# Patient Record
Sex: Male | Born: 1960 | Race: White | Hispanic: No | Marital: Married | Smoking: Never smoker
Health system: Southern US, Community
[De-identification: ages and names within clinical notes are randomized; demographics above are authoritative.]

## PROBLEM LIST (undated history)

## (undated) HISTORY — PX: GASTRIC BYPASS: SHX52

---

## 2007-07-16 ENCOUNTER — Ambulatory Visit: Payer: Self-pay | Admitting: Internal Medicine

## 2014-10-06 DIAGNOSIS — G4733 Obstructive sleep apnea (adult) (pediatric): Secondary | ICD-10-CM | POA: Insufficient documentation

## 2014-10-06 DIAGNOSIS — I10 Essential (primary) hypertension: Secondary | ICD-10-CM | POA: Insufficient documentation

## 2021-01-31 ENCOUNTER — Other Ambulatory Visit: Payer: Self-pay | Admitting: Neurology

## 2021-01-31 DIAGNOSIS — R2689 Other abnormalities of gait and mobility: Secondary | ICD-10-CM

## 2021-02-09 ENCOUNTER — Ambulatory Visit
Admission: RE | Admit: 2021-02-09 | Discharge: 2021-02-09 | Disposition: A | Discharge status: Home / Self Care | Payer: 59 | Source: Ambulatory Visit | Attending: Neurology | Admitting: Neurology

## 2021-02-09 ENCOUNTER — Ambulatory Visit: Admission: RE | Admit: 2021-02-09 | Payer: Self-pay | Source: Ambulatory Visit

## 2021-02-09 ENCOUNTER — Other Ambulatory Visit: Payer: Self-pay

## 2021-02-09 ENCOUNTER — Ambulatory Visit: Payer: Self-pay

## 2021-02-09 DIAGNOSIS — R2689 Other abnormalities of gait and mobility: Secondary | ICD-10-CM

## 2021-02-09 IMAGING — MR MR LUMBAR SPINE W/O CM
4 of 5 series · 29 of 48 positions shown · non-contrast
Comparison: None.

CLINICAL DATA: Imbalance.

EXAM:
MRI CERVICAL, THORACIC AND LUMBAR SPINE WITHOUT CONTRAST
TECHNIQUE: Multiplanar and multiecho pulse sequences of the cervical spine, to
include the craniocervical junction and cervicothoracic junction,
and thoracic and lumbar spine, were obtained without intravenous
contrast.

[Series 5: T2 · sagittal · 4.0mm · 0.81mm/px · 6 of 17 slices shown (1 of 2)]
[im 1/17]
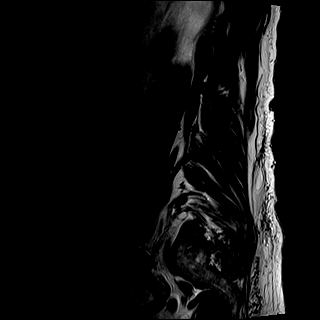
[im 4/17]
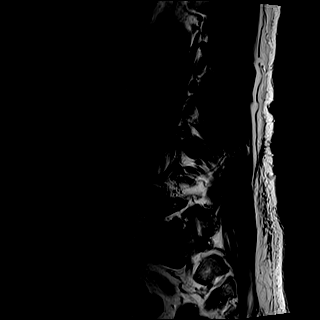
[im 7/17]
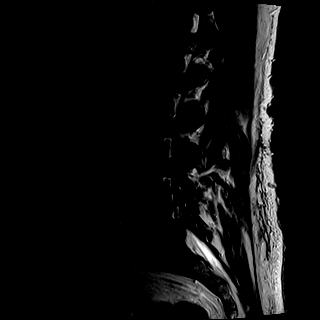
[im 10/17]
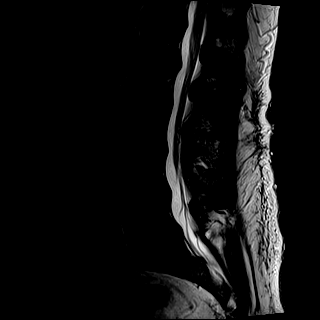
[im 13/17]
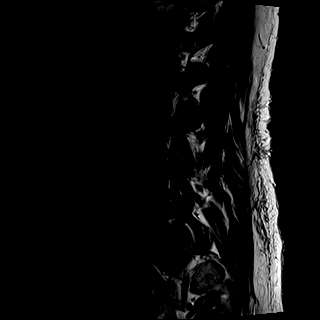
[im 17/17]
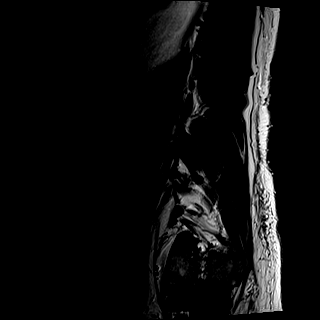

[Series 6: T1 · sagittal · 4.0mm · 0.81mm/px · 7 of 17 slices shown (1 of 2)]
[im 1/17]
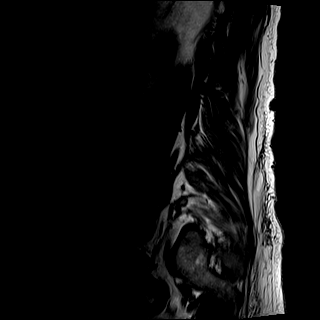
[im 3/17]
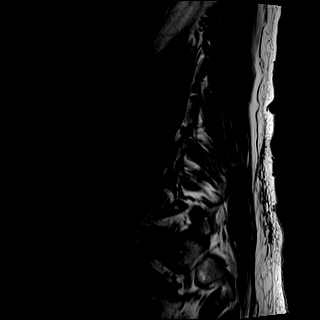
[im 6/17]
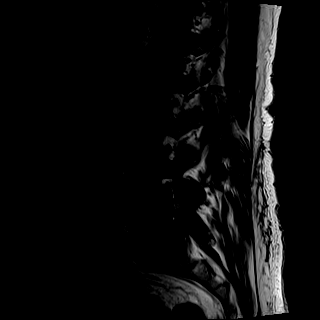
[im 9/17]
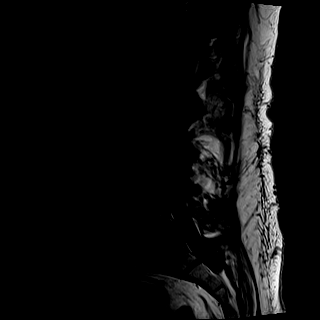
[im 11/17]
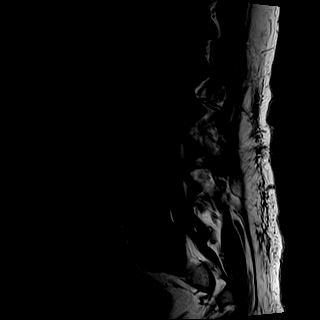
[im 14/17]
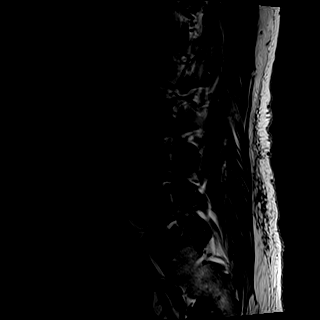
[im 17/17]
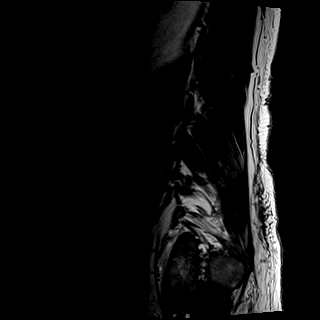

[Series 8: T2 · axial · 4.0mm · 0.78mm/px · z∈[-696,-485]mm · 8 of 36 slices shown (2 of 2)]
[im 1/36]
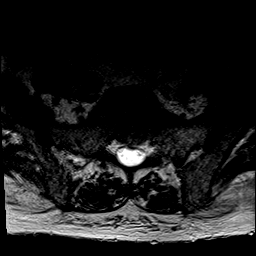
[im 6/36]
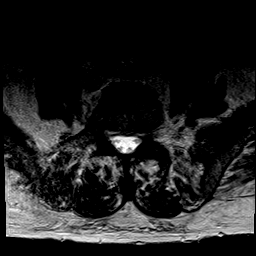
[im 11/36]
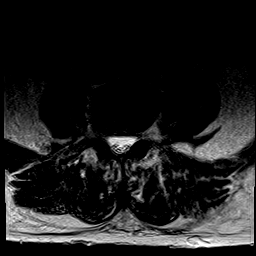
[im 17/36]
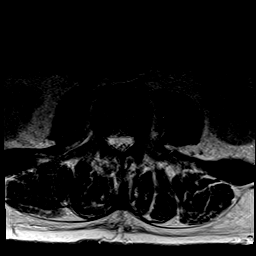
[im 19/36]
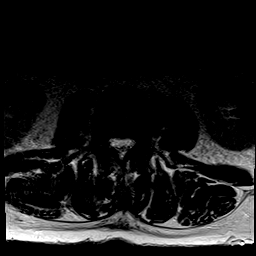
[im 25/36]
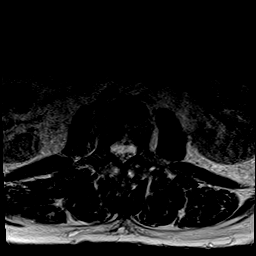
[im 30/36]
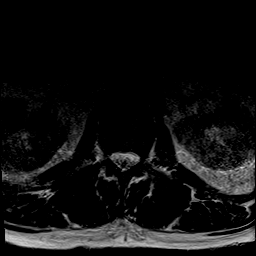
[im 36/36]
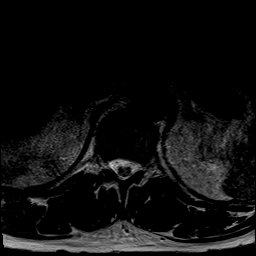

[Series 9: T1 · axial · 4.0mm · 0.39mm/px · z∈[-696,-485]mm · 8 of 36 slices shown (2 of 2)]
[im 1/36]
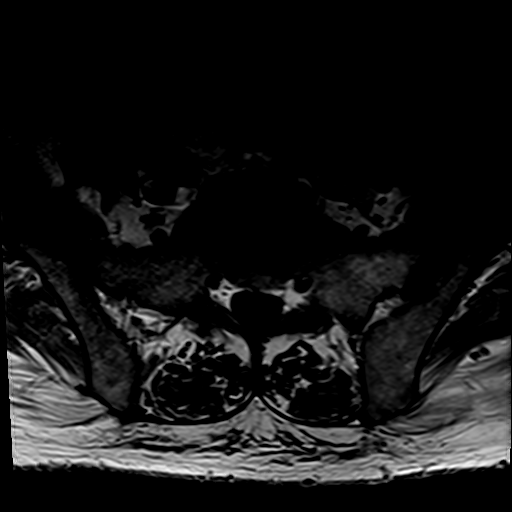
[im 6/36]
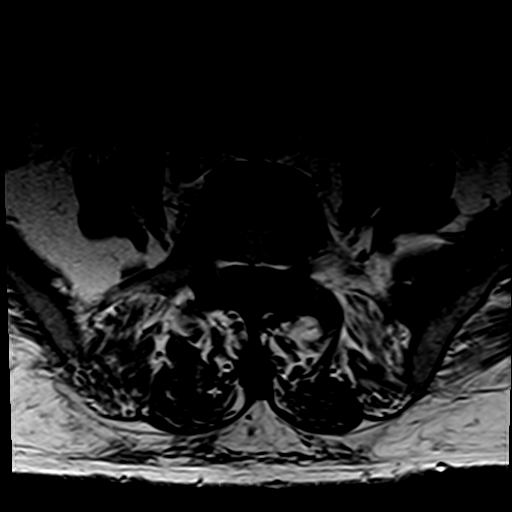
[im 11/36]
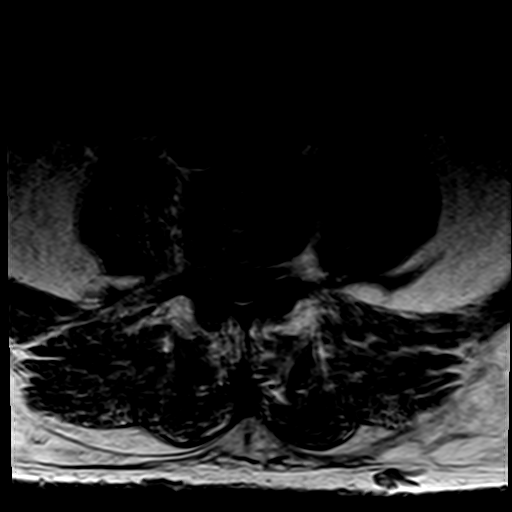
[im 17/36]
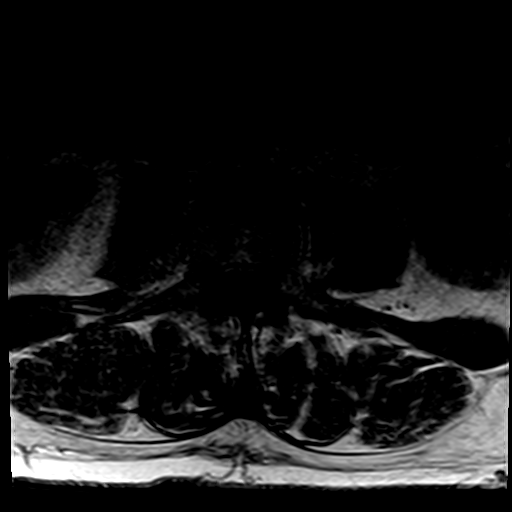
[im 19/36]
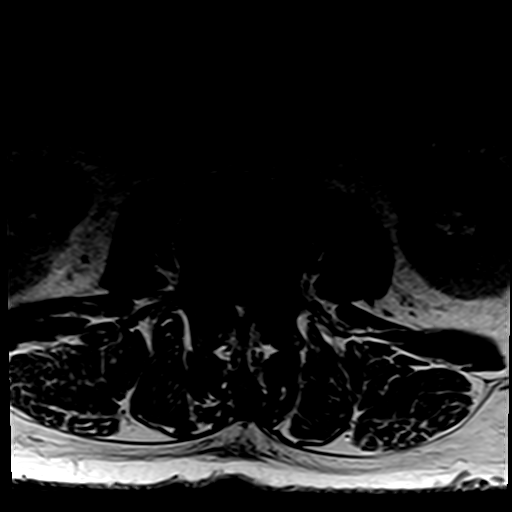
[im 25/36]
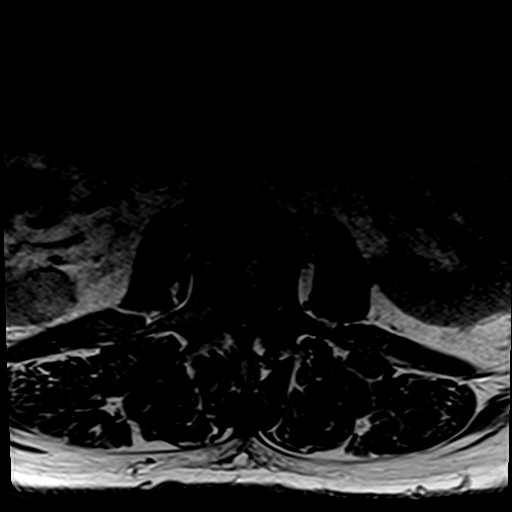
[im 30/36]
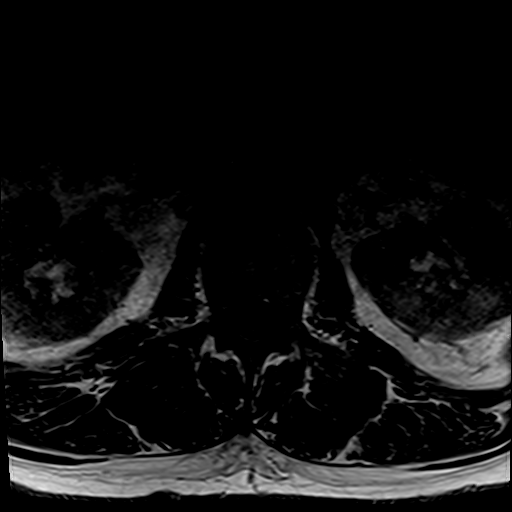
[im 36/36]
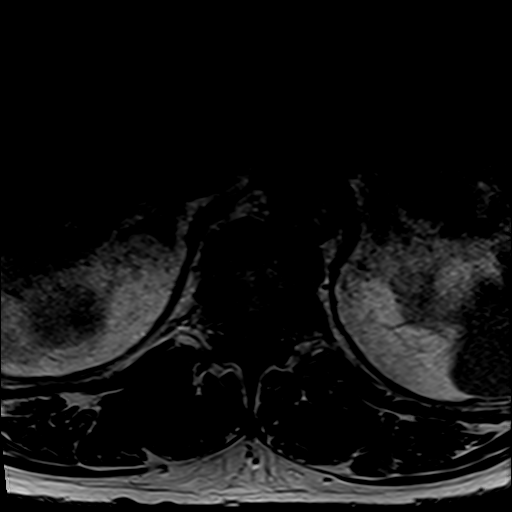

[29 of 48 positions shown; findings below may reference images not displayed]

FINDINGS: MRI CERVICAL SPINE FINDINGS

Alignment: Straightening of the cervical curvature.

Vertebrae: No fracture, evidence of discitis, or bone lesion.

Cord: Normal signal and morphology.

Posterior Fossa, vertebral arteries, paraspinal tissues: Negative.

Disc levels:

C2-3: Facet degenerative loss, left greater than right, resulting in
mild left neural foraminal narrowing. No spinal canal stenosis.

C3-4: Uncovertebral and facet degenerative changes resulting in mild
right and moderate left neural foraminal narrowing. No spinal canal
stenosis.

C4-5: Posterior disc protrusion resulting in mild spinal canal
stenosis with mild mass effect on the cord without cord signal
abnormality. Uncovertebral and facet degenerative changes resulting
severe right and moderate left neural foraminal narrowing.

C5-6: Posterior disc protrusion resulting in mild spinal canal
stenosis. Uncovertebral and facet degenerative changes resulting in
moderate bilateral neural foraminal narrowing.

C6-7: Left posterolateral disc protrude resulting in mild spinal
canal stenosis. Uncovertebral and facet degenerative changes
resulting in severe left neural foraminal narrowing.

C7-T1: Small posterior disc protrusion without significant spinal
canal or neural foraminal stenosis.

MRI THORACIC SPINE FINDINGS

Alignment:  Physiologic.

Vertebrae: No fracture, evidence of discitis, or bone lesion.
Prominent anterior osteophyte at T11-12.

Cord:  No cord signal abnormality.

Paraspinal and other soft tissues: Incidentally noted degenerative
changes of the sternoclavicular joint.

Disc levels:

T1-2: Small right paracentral disc protrusion. No significant spinal
canal or neural foraminal stenosis.

T2-3: Shallow disc bulge. No spinal canal or neural foraminal
stenosis.

T3-4: No spinal canal or neural foraminal stenosis.

T4-5: Small central disc protrusion causes small indentation of the
anterior cord surface without cord signal abnormality. No
significant spinal canal or neural foraminal stenosis.

T5-6: Small left posterolateral disc protrusion causing small
indentation of the thecal sac without significant spinal canal or
neural foraminal stenosis.

T6-7: Small central disc protrusion causing indentation of the
thecal sac without significant spinal canal or neural foraminal
stenosis.

T7-8: Right posterolateral disc protrusion causing mild mass effect
on the cord with flattening of the anterior cord contour without
cord signal abnormality. No significant spinal canal or neural
foraminal stenosis.

T8-9: No significant spinal canal or neural foraminal stenosis.

T9-10: No significant spinal canal or neural foraminal stenosis.

T10-11: Disc bulge and facet degenerative changes resulting in mild
right and moderate left neural foraminal narrowing. No significant
spinal canal stenosis.

T11-12: Mild facet degenerative changes. No spinal canal or neural
foraminal stenosis.

T12-L1: Mild facet degenerative changes. No spinal canal or neural
foraminal stenosis.

MRI LUMBAR SPINE FINDINGS

Axial images are degraded by motion.

Segmentation: Standard.

Alignment:  Physiologic.

Vertebrae:  No fracture, evidence of discitis, or bone lesion.

Conus medullaris and cauda equina: Conus extends to the L1 level.
Conus and cauda equina appear normal.

Paraspinal and other soft tissues: Negative.

Disc levels:

L1-2: Shallow disc bulge and mild facet degenerative changes
resulting in mild bilateral neural foraminal narrowing no
significant spinal canal stenosis.

L2-3: Mild facet degenerative changes. No significant spinal canal
or neural foraminal stenosis.

L3-4: Disc bulge moderate facet degenerative changes resulting in
mild spinal canal stenosis and mild bilateral neural foraminal
narrowing.

L4-5: Disc bulge and moderate facet degenerative changes with trace
joint effusion. Findings result in mild spinal canal stenosis and
moderate bilateral neural foraminal narrowing.

L5-S1: Disc bulge moderate facet degenerative changes with bilateral
joint effusion resulting in moderate to severe right and severe left
neural foraminal narrowing. No significant spinal canal stenosis.
IMPRESSION: 1. No intrinsic cord lesion or cord signal abnormality identified.
2. Degenerative changes of the cervical spine with mild spinal canal
stenosis at C4-5 level C5-6 and C6-7.
3. Multilevel high-grade neural foraminal stenosis of the cervical
spine with severe right and moderate left neural foraminal narrowing
at C4-5, moderate bilateral at C5-6 and severe left neural foraminal
narrowing at C6-7.
4. Mild degenerative changes of the thoracic spine without
high-grade spinal canal stenosis at any level. Mild right and
moderate left neural foraminal narrowing at T10-11.
5. Degenerative changes of the lumbar spine with moderate bilateral
neural foraminal narrowing at L4-5, moderate to severe right and
severe left neural foraminal narrowing at L5-S1. No high-grade
spinal canal stenosis.

## 2021-02-09 IMAGING — MR MR CERVICAL SPINE W/O CM
5 series · 35 of 48 positions shown · non-contrast
Comparison: None.

CLINICAL DATA: Imbalance.

EXAM:
MRI CERVICAL, THORACIC AND LUMBAR SPINE WITHOUT CONTRAST
TECHNIQUE: Multiplanar and multiecho pulse sequences of the cervical spine, to
include the craniocervical junction and cervicothoracic junction,
and thoracic and lumbar spine, were obtained without intravenous
contrast.

[Series 5: T2 · sagittal · 3.0mm · 0.62mm/px · 7 of 15 slices shown (1 of 2)]
[im 1/15]
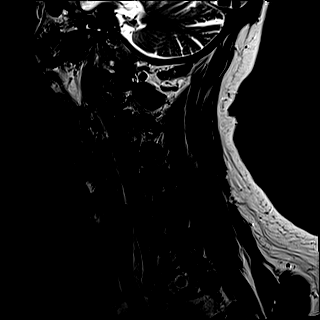
[im 3/15]
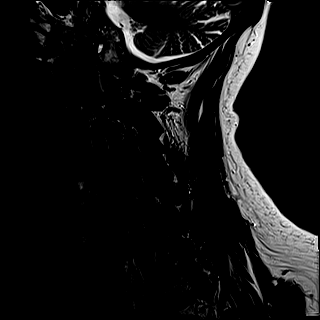
[im 5/15]
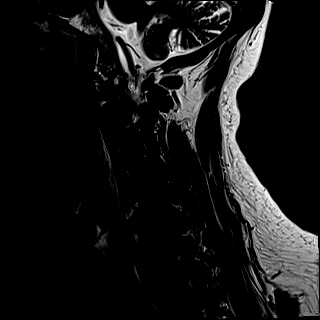
[im 8/15]
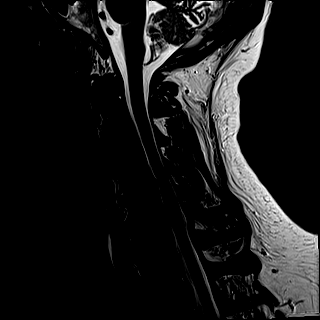
[im 10/15]
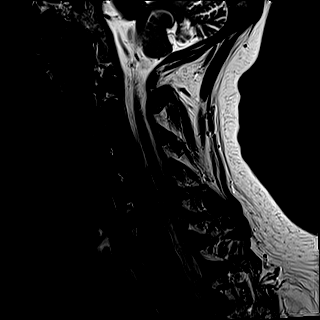
[im 12/15]
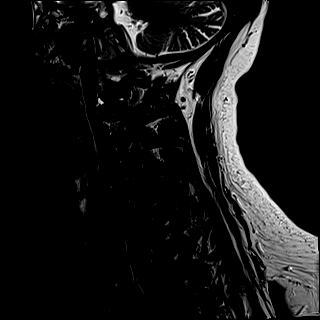
[im 15/15]
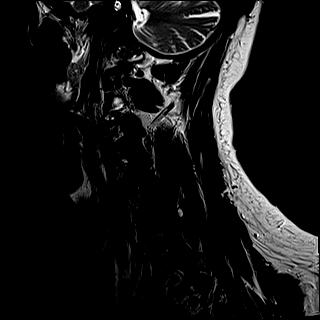

[Series 7: FLAIR · sagittal · 3.0mm · 0.78mm/px · 7 of 15 slices shown]
[im 1/15]
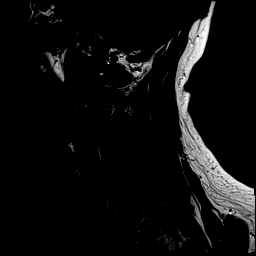
[im 3/15]
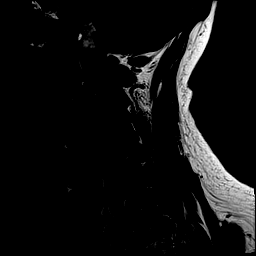
[im 5/15]
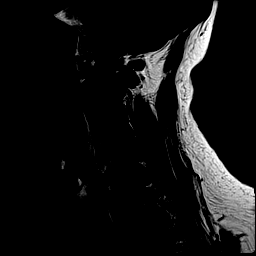
[im 8/15]
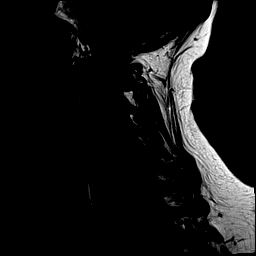
[im 10/15]
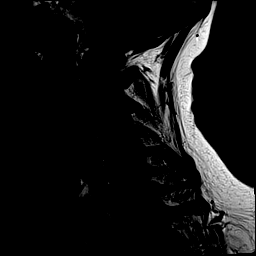
[im 12/15]
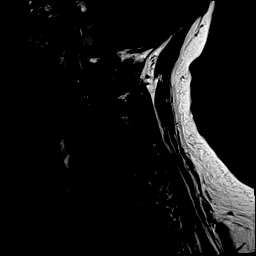
[im 15/15]
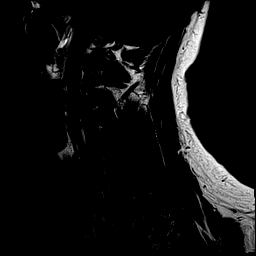

[Series 8: STIR · sagittal · 3.0mm · 0.62mm/px · 7 of 15 slices shown]
[im 1/15]
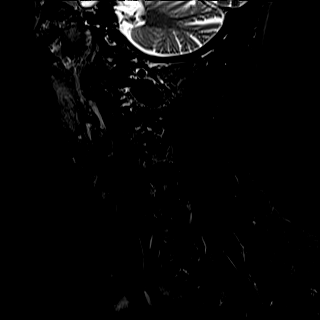
[im 3/15]
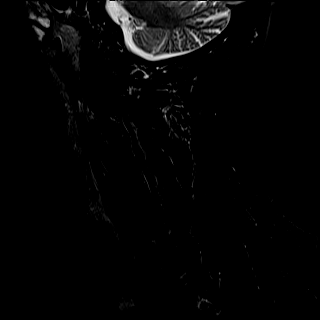
[im 5/15]
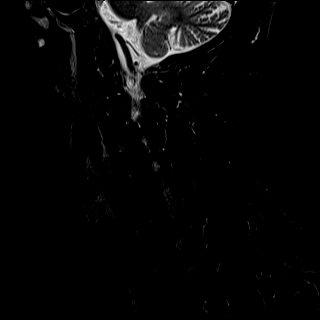
[im 8/15]
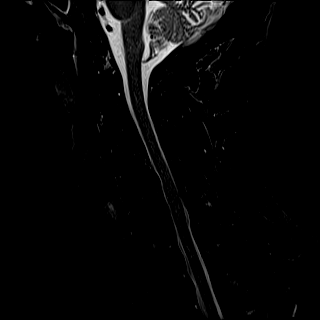
[im 10/15]
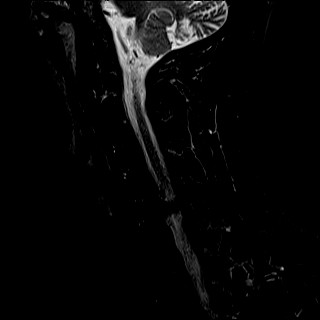
[im 12/15]
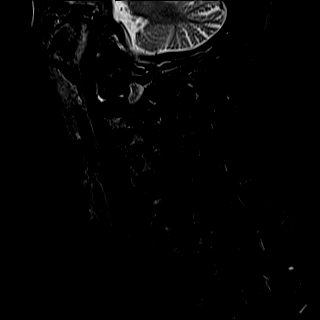
[im 15/15]
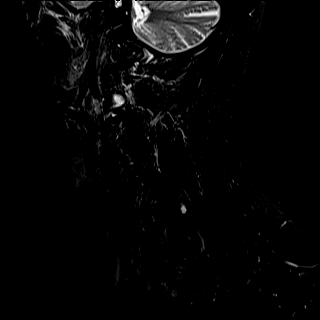

[Series 9: T2 · axial · 3.0mm · 0.70mm/px · z∈[-230,-134]mm · 9 of 28 slices shown (2 of 2)]
[im 1/28]
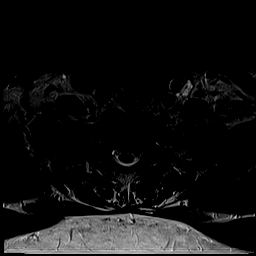
[im 5/28]
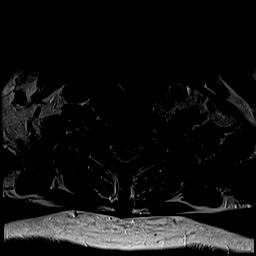
[im 10/28]
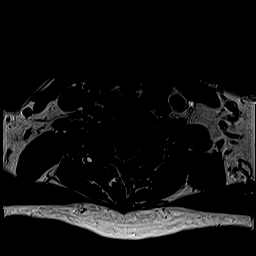
[im 12/28]
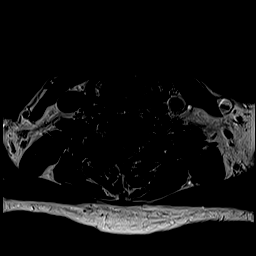
[im 14/28]
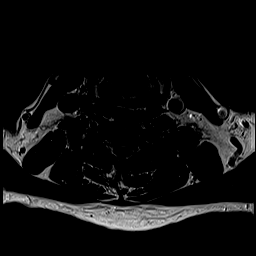
[im 16/28]
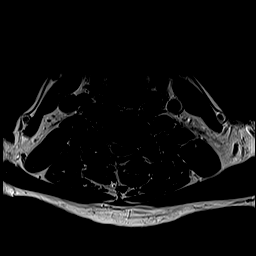
[im 19/28]
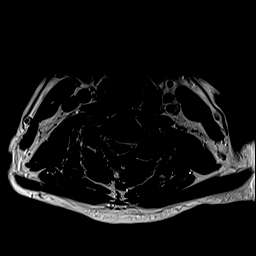
[im 23/28]
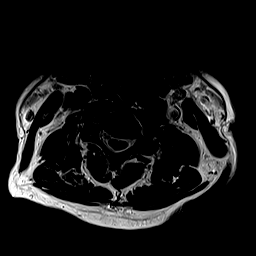
[im 28/28]
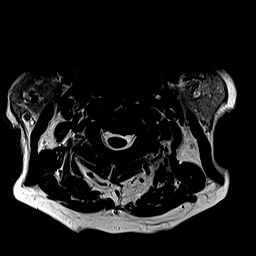

[Series 10: ax mpgr · axial · 3.0mm · 0.35mm/px · z∈[-230,-179]mm · 5 of 29 slices shown]
[im 1/29]
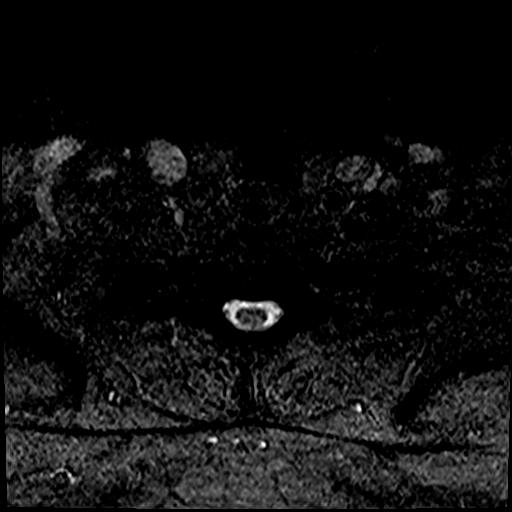
[im 5/29]
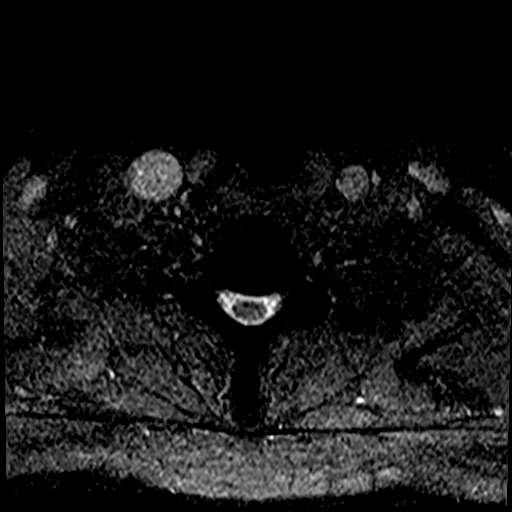
[im 9/29]
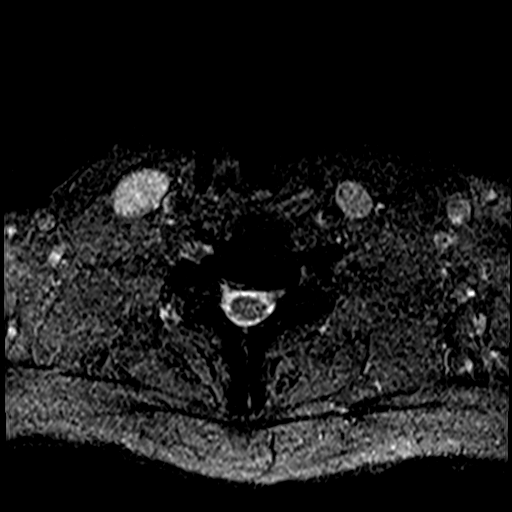
[im 13/29]
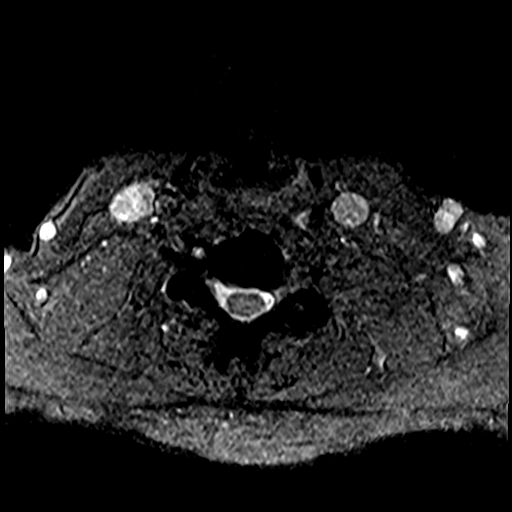
[im 16/29]
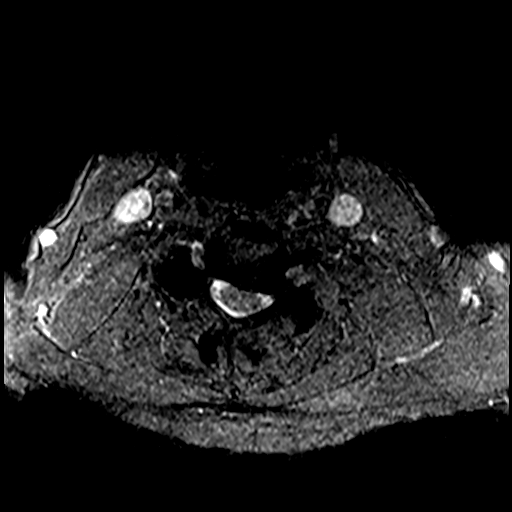

[35 of 48 positions shown; findings below may reference images not displayed]

FINDINGS: MRI CERVICAL SPINE FINDINGS

Alignment: Straightening of the cervical curvature.

Vertebrae: No fracture, evidence of discitis, or bone lesion.

Cord: Normal signal and morphology.

Posterior Fossa, vertebral arteries, paraspinal tissues: Negative.

Disc levels:

C2-3: Facet degenerative loss, left greater than right, resulting in
mild left neural foraminal narrowing. No spinal canal stenosis.

C3-4: Uncovertebral and facet degenerative changes resulting in mild
right and moderate left neural foraminal narrowing. No spinal canal
stenosis.

C4-5: Posterior disc protrusion resulting in mild spinal canal
stenosis with mild mass effect on the cord without cord signal
abnormality. Uncovertebral and facet degenerative changes resulting
severe right and moderate left neural foraminal narrowing.

C5-6: Posterior disc protrusion resulting in mild spinal canal
stenosis. Uncovertebral and facet degenerative changes resulting in
moderate bilateral neural foraminal narrowing.

C6-7: Left posterolateral disc protrude resulting in mild spinal
canal stenosis. Uncovertebral and facet degenerative changes
resulting in severe left neural foraminal narrowing.

C7-T1: Small posterior disc protrusion without significant spinal
canal or neural foraminal stenosis.

MRI THORACIC SPINE FINDINGS

Alignment:  Physiologic.

Vertebrae: No fracture, evidence of discitis, or bone lesion.
Prominent anterior osteophyte at T11-12.

Cord:  No cord signal abnormality.

Paraspinal and other soft tissues: Incidentally noted degenerative
changes of the sternoclavicular joint.

Disc levels:

T1-2: Small right paracentral disc protrusion. No significant spinal
canal or neural foraminal stenosis.

T2-3: Shallow disc bulge. No spinal canal or neural foraminal
stenosis.

T3-4: No spinal canal or neural foraminal stenosis.

T4-5: Small central disc protrusion causes small indentation of the
anterior cord surface without cord signal abnormality. No
significant spinal canal or neural foraminal stenosis.

T5-6: Small left posterolateral disc protrusion causing small
indentation of the thecal sac without significant spinal canal or
neural foraminal stenosis.

T6-7: Small central disc protrusion causing indentation of the
thecal sac without significant spinal canal or neural foraminal
stenosis.

T7-8: Right posterolateral disc protrusion causing mild mass effect
on the cord with flattening of the anterior cord contour without
cord signal abnormality. No significant spinal canal or neural
foraminal stenosis.

T8-9: No significant spinal canal or neural foraminal stenosis.

T9-10: No significant spinal canal or neural foraminal stenosis.

T10-11: Disc bulge and facet degenerative changes resulting in mild
right and moderate left neural foraminal narrowing. No significant
spinal canal stenosis.

T11-12: Mild facet degenerative changes. No spinal canal or neural
foraminal stenosis.

T12-L1: Mild facet degenerative changes. No spinal canal or neural
foraminal stenosis.

MRI LUMBAR SPINE FINDINGS

Axial images are degraded by motion.

Segmentation: Standard.

Alignment:  Physiologic.

Vertebrae:  No fracture, evidence of discitis, or bone lesion.

Conus medullaris and cauda equina: Conus extends to the L1 level.
Conus and cauda equina appear normal.

Paraspinal and other soft tissues: Negative.

Disc levels:

L1-2: Shallow disc bulge and mild facet degenerative changes
resulting in mild bilateral neural foraminal narrowing no
significant spinal canal stenosis.

L2-3: Mild facet degenerative changes. No significant spinal canal
or neural foraminal stenosis.

L3-4: Disc bulge moderate facet degenerative changes resulting in
mild spinal canal stenosis and mild bilateral neural foraminal
narrowing.

L4-5: Disc bulge and moderate facet degenerative changes with trace
joint effusion. Findings result in mild spinal canal stenosis and
moderate bilateral neural foraminal narrowing.

L5-S1: Disc bulge moderate facet degenerative changes with bilateral
joint effusion resulting in moderate to severe right and severe left
neural foraminal narrowing. No significant spinal canal stenosis.
IMPRESSION: 1. No intrinsic cord lesion or cord signal abnormality identified.
2. Degenerative changes of the cervical spine with mild spinal canal
stenosis at C4-5 level C5-6 and C6-7.
3. Multilevel high-grade neural foraminal stenosis of the cervical
spine with severe right and moderate left neural foraminal narrowing
at C4-5, moderate bilateral at C5-6 and severe left neural foraminal
narrowing at C6-7.
4. Mild degenerative changes of the thoracic spine without
high-grade spinal canal stenosis at any level. Mild right and
moderate left neural foraminal narrowing at T10-11.
5. Degenerative changes of the lumbar spine with moderate bilateral
neural foraminal narrowing at L4-5, moderate to severe right and
severe left neural foraminal narrowing at L5-S1. No high-grade
spinal canal stenosis.

## 2021-02-09 IMAGING — MR MR THORACIC SPINE W/O CM
5 of 6 series · 26 of 48 positions shown · non-contrast
Comparison: None.

CLINICAL DATA: Imbalance.

EXAM:
MRI CERVICAL, THORACIC AND LUMBAR SPINE WITHOUT CONTRAST
TECHNIQUE: Multiplanar and multiecho pulse sequences of the cervical spine, to
include the craniocervical junction and cervicothoracic junction,
and thoracic and lumbar spine, were obtained without intravenous
contrast.

[Series 22: T1 · sagittal · 6.0mm · 1.41mm/px · 2 of 9 slices shown (1 of 2)]
[im 1/9]
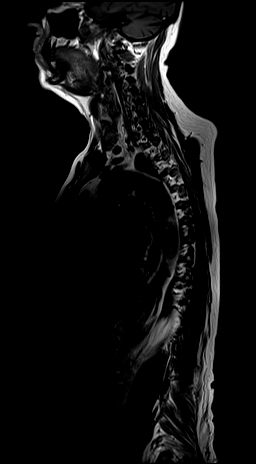
[im 9/9]
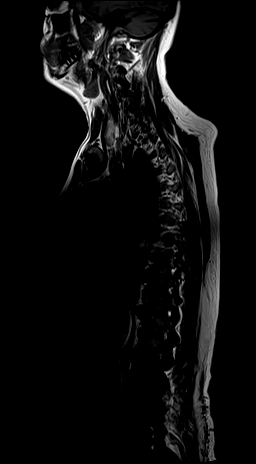

[Series 23: T2 · sagittal · 3.0mm · 1.06mm/px · 6 of 17 slices shown (1 of 2)]
[im 1/17]
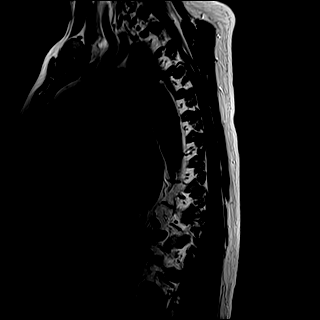
[im 4/17]
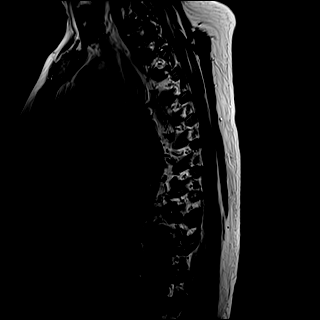
[im 7/17]
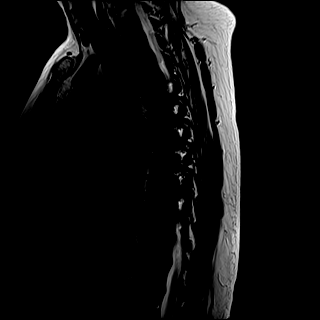
[im 10/17]
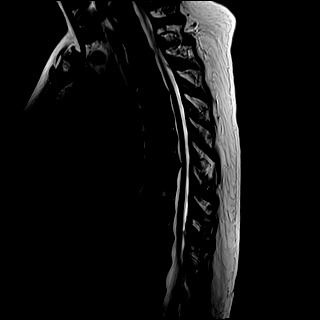
[im 13/17]
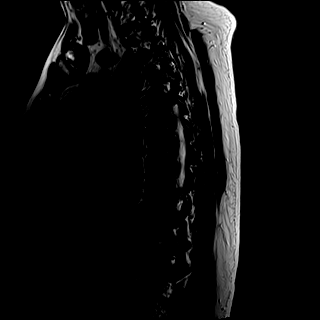
[im 17/17]
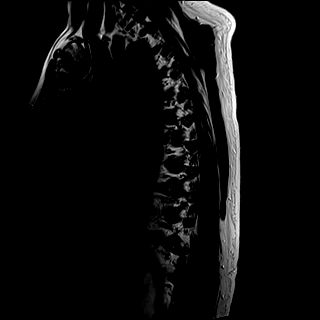

[Series 24: T1 · sagittal · 3.0mm · 1.06mm/px · 6 of 17 slices shown (2 of 2)]
[im 1/17]
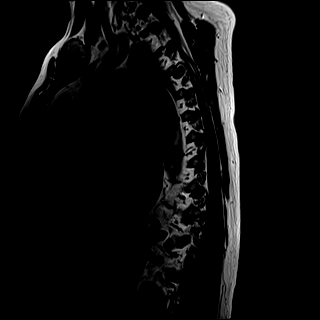
[im 4/17]
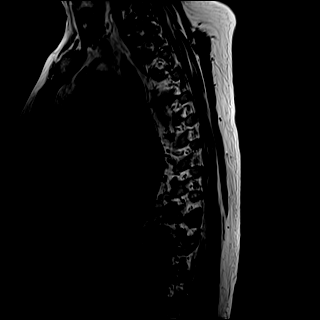
[im 7/17]
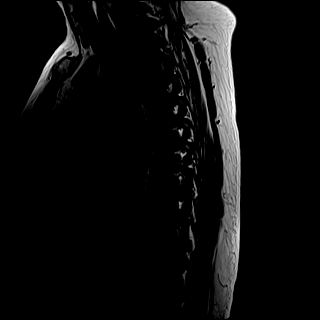
[im 10/17]
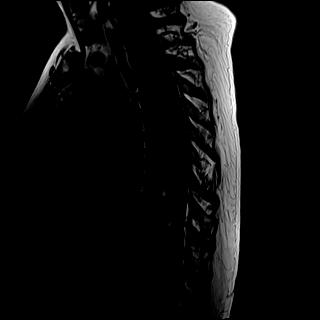
[im 13/17]
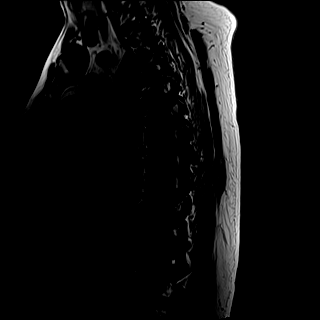
[im 17/17]
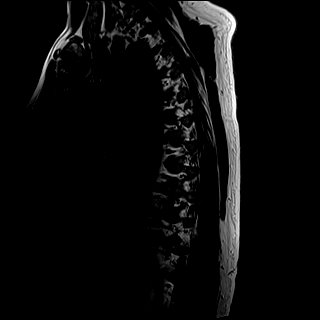

[Series 25: STIR · sagittal · 3.0mm · 0.53mm/px · 4 of 17 slices shown]
[im 1/17]
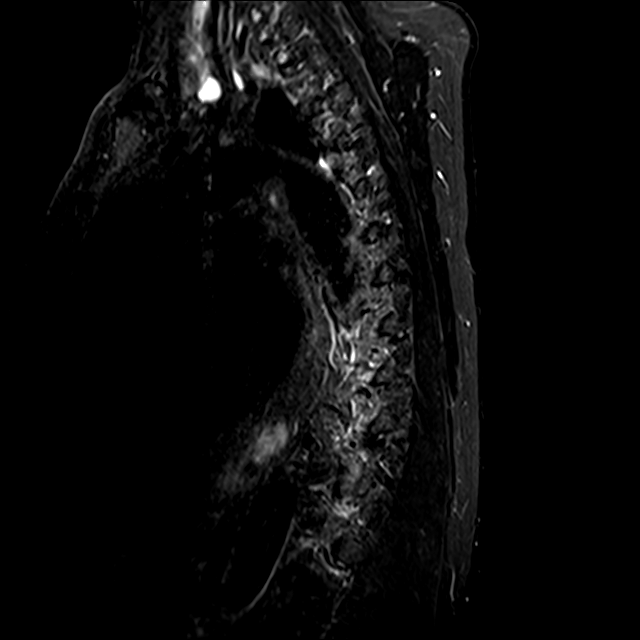
[im 4/17]
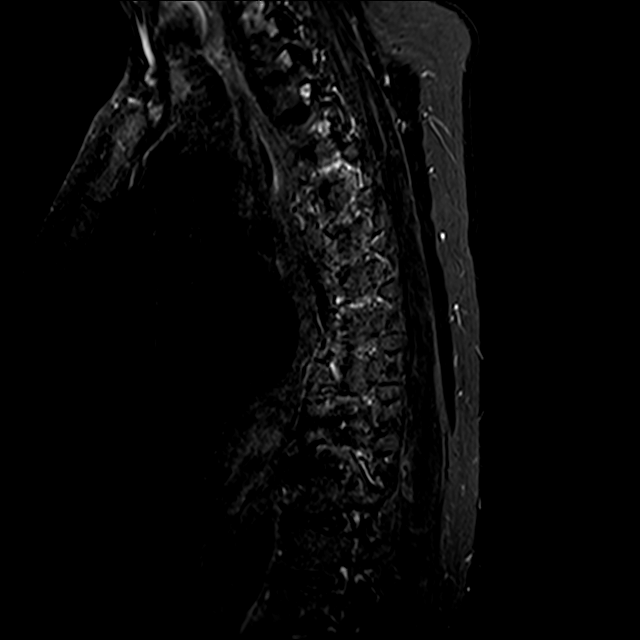
[im 7/17]
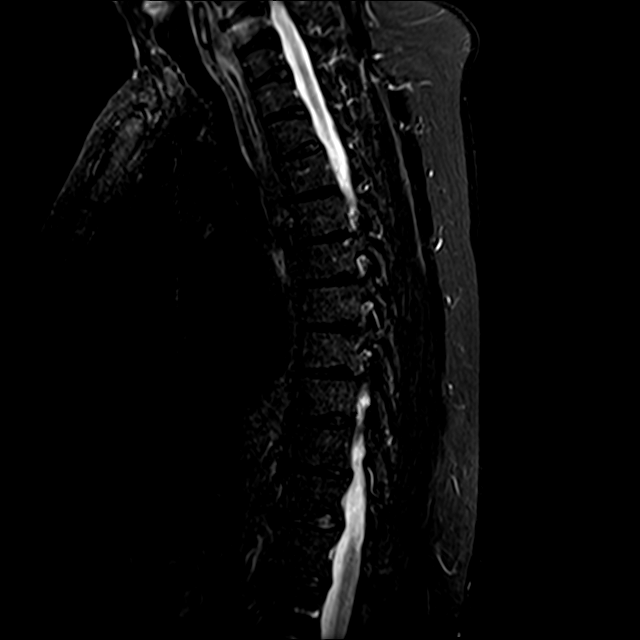
[im 10/17]
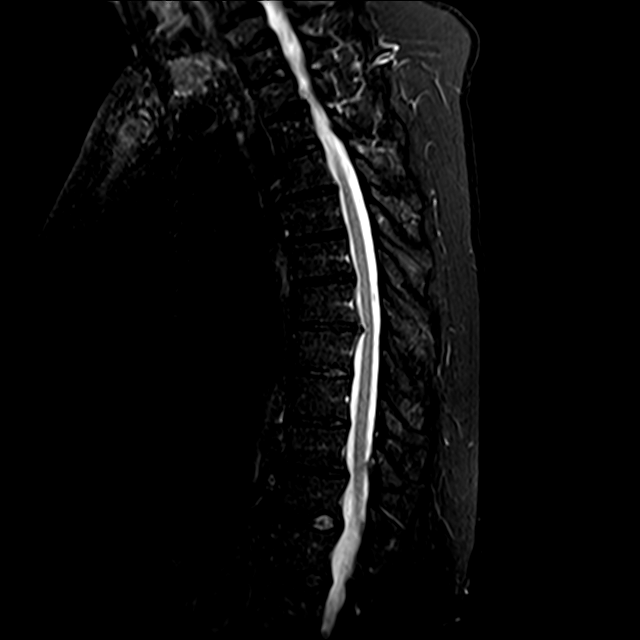

[Series 26: T2 · axial · 4.0mm · 0.59mm/px · z∈[-483,-242]mm · 8 of 39 slices shown (2 of 2)]
[im 1/39]
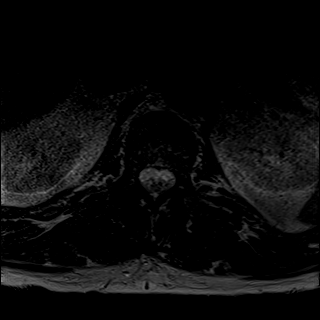
[im 6/39]
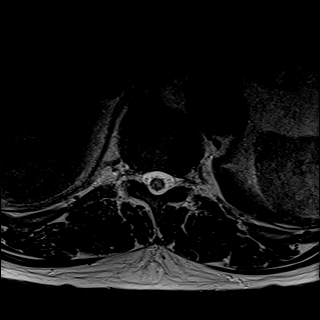
[im 12/39]
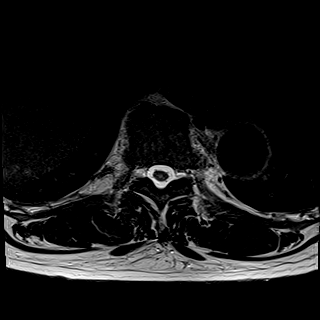
[im 18/39]
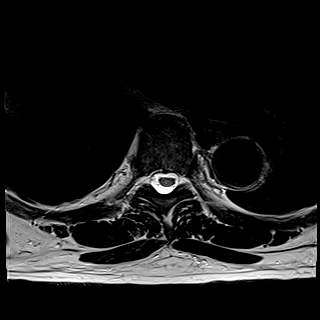
[im 21/39]
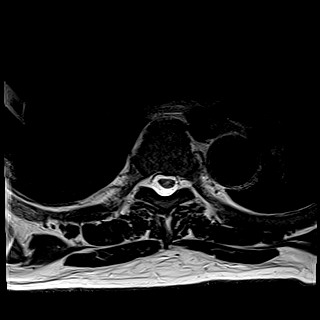
[im 27/39]
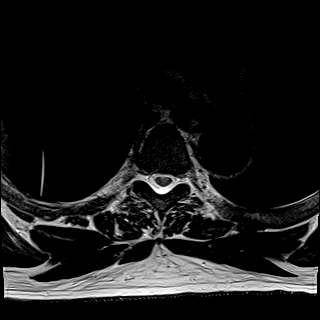
[im 33/39]
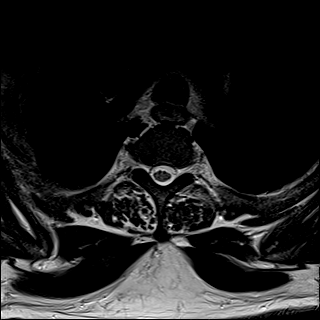
[im 39/39]
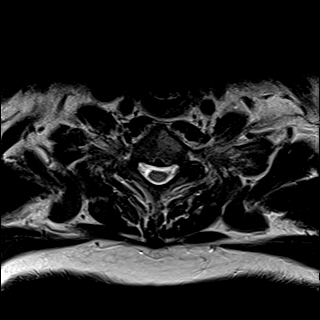

[26 of 48 positions shown; findings below may reference images not displayed]

FINDINGS: MRI CERVICAL SPINE FINDINGS

Alignment: Straightening of the cervical curvature.

Vertebrae: No fracture, evidence of discitis, or bone lesion.

Cord: Normal signal and morphology.

Posterior Fossa, vertebral arteries, paraspinal tissues: Negative.

Disc levels:

C2-3: Facet degenerative loss, left greater than right, resulting in
mild left neural foraminal narrowing. No spinal canal stenosis.

C3-4: Uncovertebral and facet degenerative changes resulting in mild
right and moderate left neural foraminal narrowing. No spinal canal
stenosis.

C4-5: Posterior disc protrusion resulting in mild spinal canal
stenosis with mild mass effect on the cord without cord signal
abnormality. Uncovertebral and facet degenerative changes resulting
severe right and moderate left neural foraminal narrowing.

C5-6: Posterior disc protrusion resulting in mild spinal canal
stenosis. Uncovertebral and facet degenerative changes resulting in
moderate bilateral neural foraminal narrowing.

C6-7: Left posterolateral disc protrude resulting in mild spinal
canal stenosis. Uncovertebral and facet degenerative changes
resulting in severe left neural foraminal narrowing.

C7-T1: Small posterior disc protrusion without significant spinal
canal or neural foraminal stenosis.

MRI THORACIC SPINE FINDINGS

Alignment:  Physiologic.

Vertebrae: No fracture, evidence of discitis, or bone lesion.
Prominent anterior osteophyte at T11-12.

Cord:  No cord signal abnormality.

Paraspinal and other soft tissues: Incidentally noted degenerative
changes of the sternoclavicular joint.

Disc levels:

T1-2: Small right paracentral disc protrusion. No significant spinal
canal or neural foraminal stenosis.

T2-3: Shallow disc bulge. No spinal canal or neural foraminal
stenosis.

T3-4: No spinal canal or neural foraminal stenosis.

T4-5: Small central disc protrusion causes small indentation of the
anterior cord surface without cord signal abnormality. No
significant spinal canal or neural foraminal stenosis.

T5-6: Small left posterolateral disc protrusion causing small
indentation of the thecal sac without significant spinal canal or
neural foraminal stenosis.

T6-7: Small central disc protrusion causing indentation of the
thecal sac without significant spinal canal or neural foraminal
stenosis.

T7-8: Right posterolateral disc protrusion causing mild mass effect
on the cord with flattening of the anterior cord contour without
cord signal abnormality. No significant spinal canal or neural
foraminal stenosis.

T8-9: No significant spinal canal or neural foraminal stenosis.

T9-10: No significant spinal canal or neural foraminal stenosis.

T10-11: Disc bulge and facet degenerative changes resulting in mild
right and moderate left neural foraminal narrowing. No significant
spinal canal stenosis.

T11-12: Mild facet degenerative changes. No spinal canal or neural
foraminal stenosis.

T12-L1: Mild facet degenerative changes. No spinal canal or neural
foraminal stenosis.

MRI LUMBAR SPINE FINDINGS

Axial images are degraded by motion.

Segmentation: Standard.

Alignment:  Physiologic.

Vertebrae:  No fracture, evidence of discitis, or bone lesion.

Conus medullaris and cauda equina: Conus extends to the L1 level.
Conus and cauda equina appear normal.

Paraspinal and other soft tissues: Negative.

Disc levels:

L1-2: Shallow disc bulge and mild facet degenerative changes
resulting in mild bilateral neural foraminal narrowing no
significant spinal canal stenosis.

L2-3: Mild facet degenerative changes. No significant spinal canal
or neural foraminal stenosis.

L3-4: Disc bulge moderate facet degenerative changes resulting in
mild spinal canal stenosis and mild bilateral neural foraminal
narrowing.

L4-5: Disc bulge and moderate facet degenerative changes with trace
joint effusion. Findings result in mild spinal canal stenosis and
moderate bilateral neural foraminal narrowing.

L5-S1: Disc bulge moderate facet degenerative changes with bilateral
joint effusion resulting in moderate to severe right and severe left
neural foraminal narrowing. No significant spinal canal stenosis.
IMPRESSION: 1. No intrinsic cord lesion or cord signal abnormality identified.
2. Degenerative changes of the cervical spine with mild spinal canal
stenosis at C4-5 level C5-6 and C6-7.
3. Multilevel high-grade neural foraminal stenosis of the cervical
spine with severe right and moderate left neural foraminal narrowing
at C4-5, moderate bilateral at C5-6 and severe left neural foraminal
narrowing at C6-7.
4. Mild degenerative changes of the thoracic spine without
high-grade spinal canal stenosis at any level. Mild right and
moderate left neural foraminal narrowing at T10-11.
5. Degenerative changes of the lumbar spine with moderate bilateral
neural foraminal narrowing at L4-5, moderate to severe right and
severe left neural foraminal narrowing at L5-S1. No high-grade
spinal canal stenosis.

## 2021-02-09 IMAGING — MR MR HEAD W/O CM
12 series · 47 of 48 positions shown · non-contrast
Comparison: None.

CLINICAL DATA: Imbalance.

EXAM:
MRI HEAD WITHOUT CONTRAST
TECHNIQUE: Multiplanar, multiecho pulse sequences of the brain and surrounding
structures were obtained without intravenous contrast.

[Series 5: ax dwi_tracew · axial · 3.0mm · 0.65mm/px · z∈[-75,+78]mm · 4 of 48 slices shown]
[im 1/48]
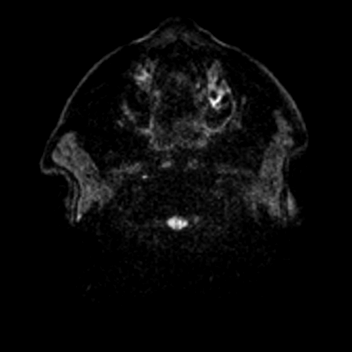
[im 16/48]
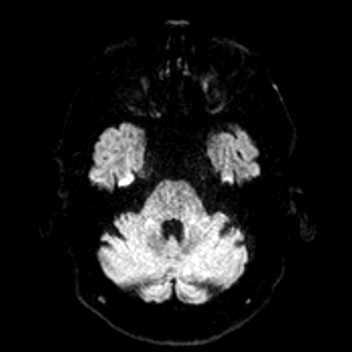
[im 32/48]
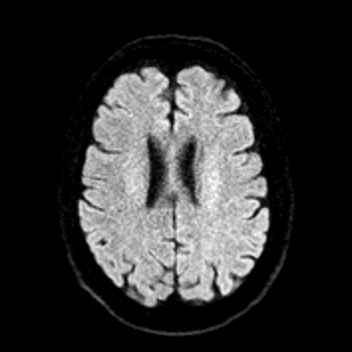
[im 48/48]
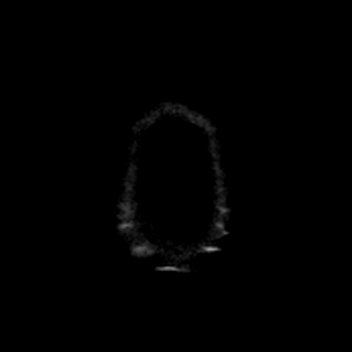

[Series 6: ax dwi_adc · axial · 3.0mm · 0.65mm/px · z∈[-75,+78]mm · 3 of 48 slices shown]
[im 1/48]
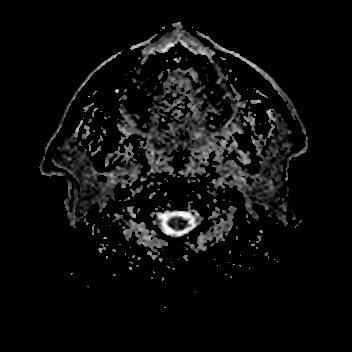
[im 24/48]
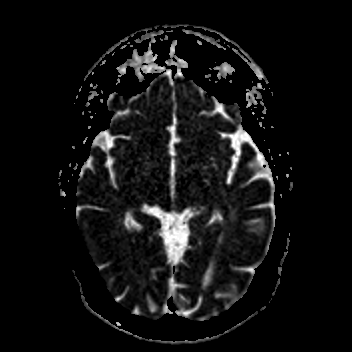
[im 48/48]
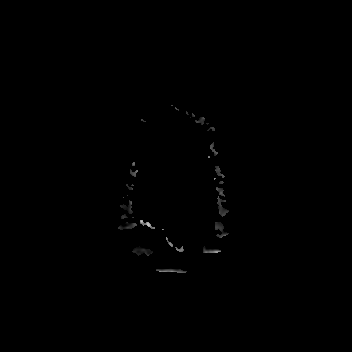

[Series 7: cor dwi_tracew · coronal · 5.0mm · 0.60mm/px · 3 of 38 slices shown]
[im 1/38]
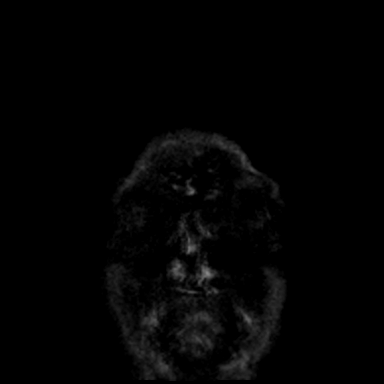
[im 19/38]
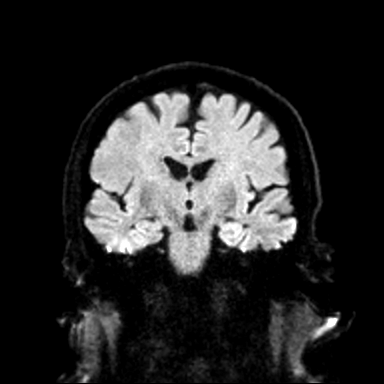
[im 38/38]
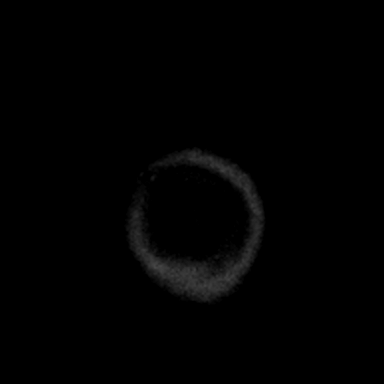

[Series 8: cor dwi_adc · coronal · 5.0mm · 0.60mm/px · 3 of 38 slices shown]
[im 1/38]
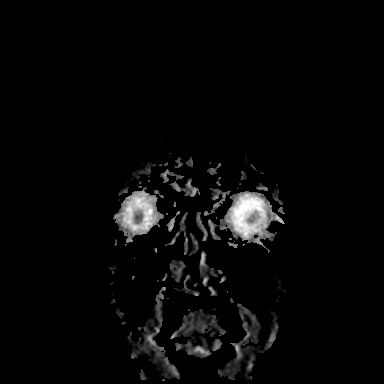
[im 19/38]
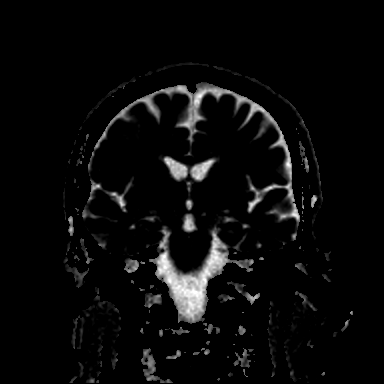
[im 38/38]
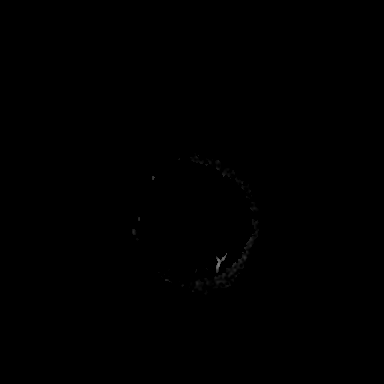

[Series 9: T1 · sagittal · 5.0mm · 0.62mm/px · 2 of 25 slices shown (1 of 2)]
[im 1/25]
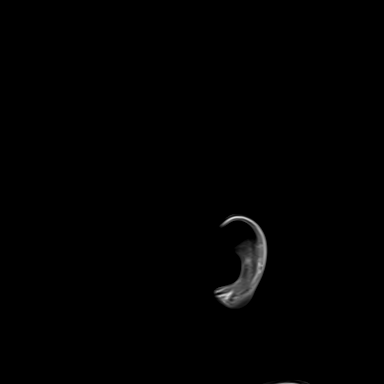
[im 25/25]
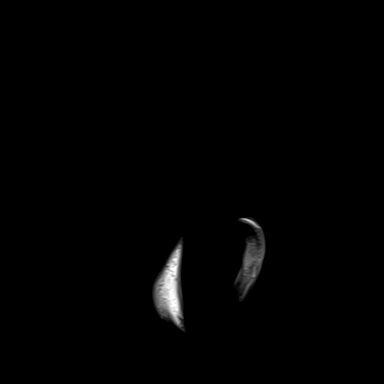

[Series 10: T2 · axial · 5.0mm · 0.53mm/px · z∈[-71,+72]mm · 2 of 25 slices shown (1 of 2)]
[im 1/25]
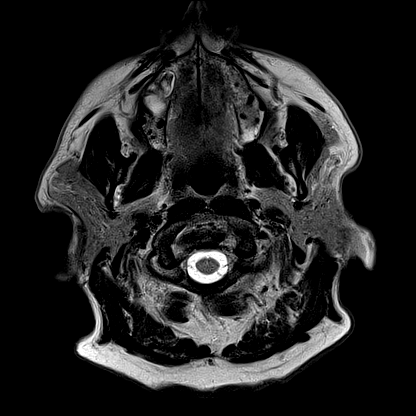
[im 25/25]
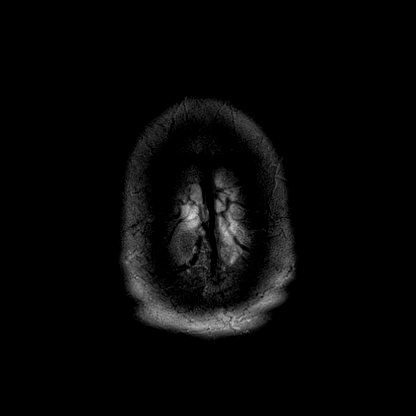

[Series 11: mag_images · axial · 3.0mm · 0.90mm/px · z∈[-87,+88]mm · 4 of 60 slices shown]
[im 1/60]
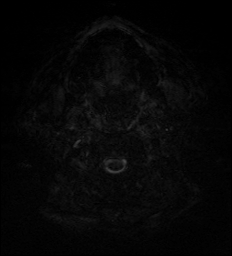
[im 20/60]
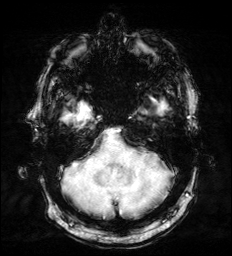
[im 40/60]
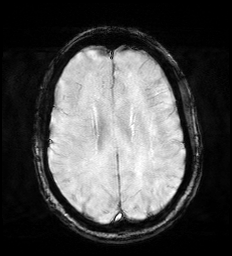
[im 60/60]
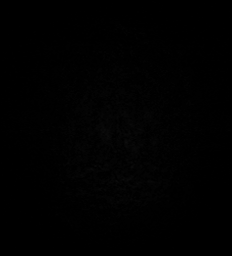

[Series 12: pha_images · axial · 3.0mm · 0.90mm/px · z∈[-87,+85]mm · 4 of 59 slices shown]
[im 1/59]
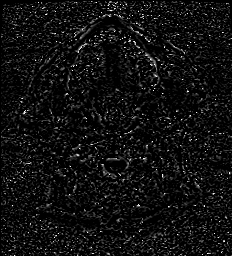
[im 20/59]
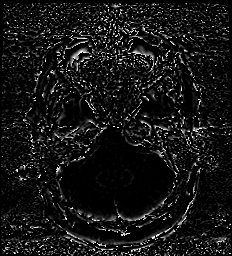
[im 39/59]
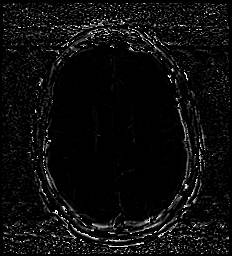
[im 59/59]
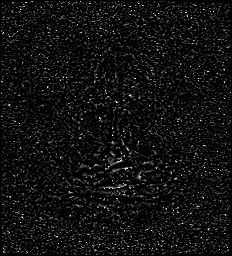

[Series 13: swi_images · axial · 3.0mm · 0.90mm/px · z∈[-87,+88]mm · 4 of 60 slices shown]
[im 1/60]
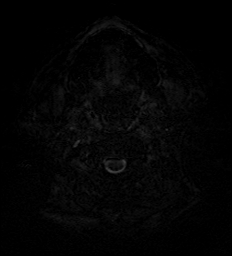
[im 20/60]
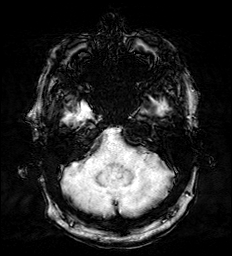
[im 40/60]
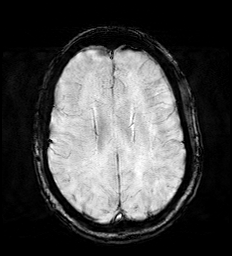
[im 60/60]
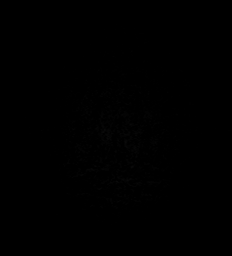

[Series 15: FLAIR · axial · 3.0mm · 0.53mm/px · z∈[-79,+81]mm · 4 of 55 slices shown]
[im 1/55]
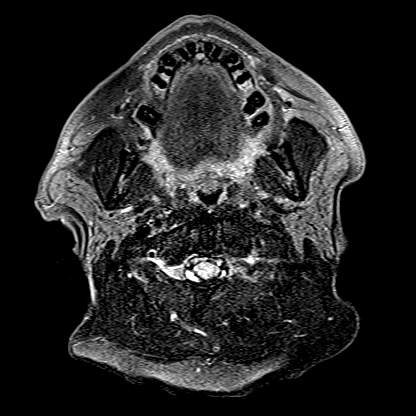
[im 19/55]
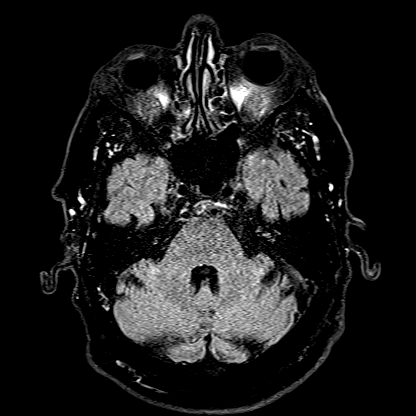
[im 37/55]
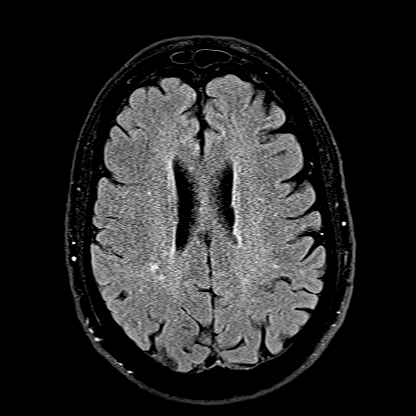
[im 55/55]
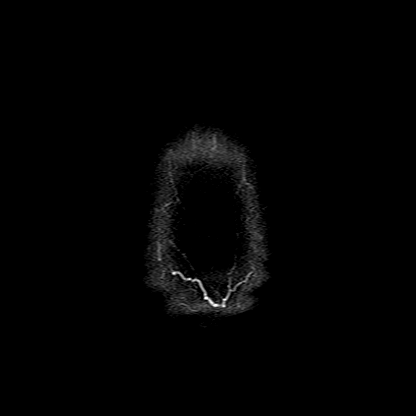

[Series 16: T1 · axial · 1.0mm · 0.98mm/px · z∈[-84,+89]mm · 12 of 176 slices shown (2 of 2)]
[im 1/176]
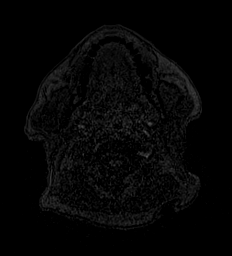
[im 15/176]
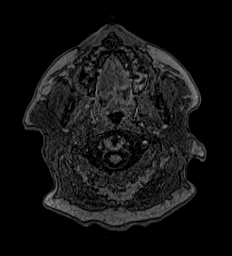
[im 30/176]
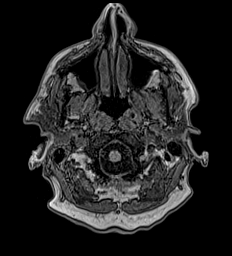
[im 44/176]
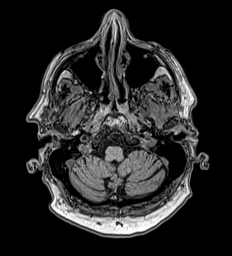
[im 59/176]
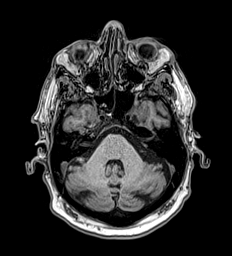
[im 73/176]
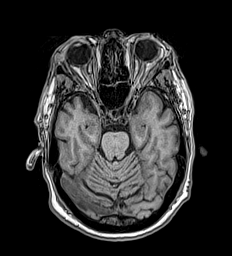
[im 88/176]
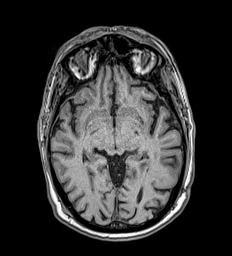
[im 103/176]
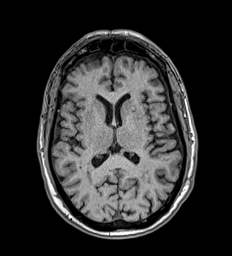
[im 117/176]
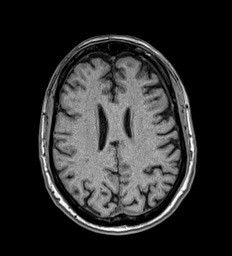
[im 132/176]
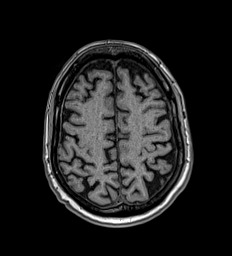
[im 146/176]
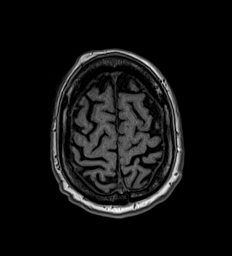
[im 176/176]
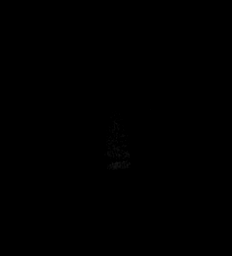

[Series 17: T2 · coronal · 5.0mm · 0.57mm/px · 2 of 29 slices shown (2 of 2)]
[im 1/29]
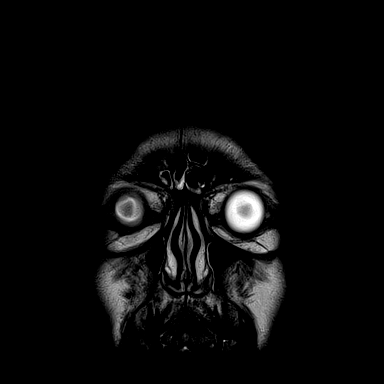
[im 29/29]
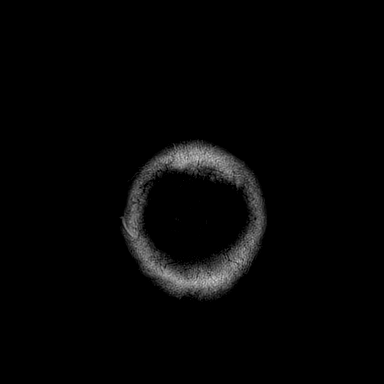

[47 of 48 positions shown; findings below may reference images not displayed]

FINDINGS: Brain: No acute infarction, hemorrhage, hydrocephalus, extra-axial
collection or mass lesion. Scattered foci of T2 hyperintensity are
seen within the white matter of the cerebral hemispheres
nonspecific. Mild prominence of the cerebellar sulci, reflecting
parenchymal volume loss.

Vascular: Normal flow voids.

Skull and upper cervical spine: Normal marrow signal.

Sinuses/Orbits: Trace mucosal thickening throughout the paranasal
sinuses. The orbits are maintained.

Other: None.
IMPRESSION: 1. Small amount of nonspecific T2 hyperintense lesions of the white
matter, may represent mild chronic microangiopathy.
2. Mild prominence of the cerebellar sulci, reflecting parenchymal
volume loss.

## 2021-03-10 DIAGNOSIS — R2689 Other abnormalities of gait and mobility: Secondary | ICD-10-CM | POA: Insufficient documentation

## 2021-04-16 DIAGNOSIS — G621 Alcoholic polyneuropathy: Secondary | ICD-10-CM | POA: Insufficient documentation

## 2021-05-15 ENCOUNTER — Other Ambulatory Visit (INDEPENDENT_AMBULATORY_CARE_PROVIDER_SITE_OTHER): Payer: Self-pay | Admitting: Nurse Practitioner

## 2021-05-15 DIAGNOSIS — R209 Unspecified disturbances of skin sensation: Secondary | ICD-10-CM

## 2021-05-15 DIAGNOSIS — I739 Peripheral vascular disease, unspecified: Secondary | ICD-10-CM

## 2021-05-16 ENCOUNTER — Encounter (INDEPENDENT_AMBULATORY_CARE_PROVIDER_SITE_OTHER): Payer: Self-pay | Admitting: Vascular Surgery

## 2021-05-16 ENCOUNTER — Ambulatory Visit (INDEPENDENT_AMBULATORY_CARE_PROVIDER_SITE_OTHER): Payer: 59

## 2021-05-16 ENCOUNTER — Other Ambulatory Visit: Payer: Self-pay

## 2021-05-16 ENCOUNTER — Ambulatory Visit (INDEPENDENT_AMBULATORY_CARE_PROVIDER_SITE_OTHER): Payer: 59 | Admitting: Vascular Surgery

## 2021-05-16 DIAGNOSIS — G629 Polyneuropathy, unspecified: Secondary | ICD-10-CM | POA: Diagnosis not present

## 2021-05-16 DIAGNOSIS — M79605 Pain in left leg: Secondary | ICD-10-CM | POA: Diagnosis not present

## 2021-05-16 DIAGNOSIS — I739 Peripheral vascular disease, unspecified: Secondary | ICD-10-CM | POA: Diagnosis not present

## 2021-05-16 DIAGNOSIS — R209 Unspecified disturbances of skin sensation: Secondary | ICD-10-CM

## 2021-05-16 DIAGNOSIS — M79604 Pain in right leg: Secondary | ICD-10-CM

## 2021-05-16 DIAGNOSIS — M79609 Pain in unspecified limb: Secondary | ICD-10-CM | POA: Insufficient documentation

## 2021-05-16 NOTE — Assessment & Plan Note (Signed)
His ABIs were normal at 1.34 on the right and 1.36 on the left with triphasic waveforms and normal digital pressures bilaterally consistent with no arterial insufficiency in either lower extremity.  It does not appear as if his lower extremity symptoms are related to arterial insufficiency.  Return to clinic as needed.

## 2021-05-16 NOTE — Assessment & Plan Note (Signed)
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 His ABIs were normal at 1.34 on the right and 1.36 on the left with triphasic waveforms and normal digital pressures bilaterally consistent with no arterial insufficiency in either lower extremity.  He has been started on Neurontin by his neurologist.  He does not have any ischemic component.  At this point, we will see the patient back as needed and no further vascular work-up or intervention is planned.

## 2021-05-16 NOTE — Progress Notes (Signed)
Patient ID: Billy Campbell, male   DOB: 05/15/1960, 61 y.o.   MRN: [id number]  Chief Complaint  Patient presents with   New Patient (Initial Visit)    Ref Manuella Ghazi consult cold feet,PAD,venous stasis    HPI Billy Campbell is a 61 y.o. male.  I am asked to see the patient by Dr. Manuella Ghazi for evaluation of cold discolored feet as well as pain and weakness in both legs with activity.  The patient reports this started about a year and a half ago after his COVID shot.  Prior to that, he had no significant leg pain.  He denies any trauma, injury, or other issues that would have caused the discomfort.  He reports both legs are affected.  He has no nonhealing ulceration or infection.  He denies any fevers or chills.  To evaluate his arterial perfusion noninvasive studies were performed today.  His ABIs were normal at 1.34 on the right and 1.36 on the left with triphasic waveforms and normal digital pressures bilaterally consistent with no arterial insufficiency in either lower extremity.     No past medical history on file.     Family History  Problem Relation Age of Onset   Hypertension Mother    Dementia Mother    Cancer Father    Stroke Father    Heart attack Father       Social History   Tobacco Use   Smoking status: Never   Smokeless tobacco: Current  Substance Use Topics   Alcohol use: Yes   Drug use: Never     No Known Allergies  Current Outpatient Medications  Medication Sig Dispense Refill   acetaminophen (TYLENOL) 500 MG tablet Take by mouth.     allopurinol (ZYLOPRIM) 300 MG tablet Take 300 mg by mouth daily.     cyanocobalamin 1000 MCG tablet Take by mouth.     diclofenac Sodium (VOLTAREN) 1 % GEL Apply 2 g topically 4 (four) times daily.     gabapentin (NEURONTIN) 300 MG capsule Take by mouth.     Ibuprofen 200 MG CAPS Take 400 mg by mouth as needed.     Misc Natural Products (OSTEO BI-FLEX TRIPLE STRENGTH PO) Take by mouth daily.     thiamine 100 MG tablet Take by  mouth.     vitamin E 1000 UNIT capsule Take by mouth.     No current facility-administered medications for this visit.      REVIEW OF SYSTEMS (Negative unless checked)  Constitutional: [] Weight loss  [] Fever  [] Chills Cardiac: [] Chest pain   [] Chest pressure   [] Palpitations   [] Shortness of breath when laying flat   [] Shortness of breath at rest   [] Shortness of breath with exertion. Vascular:  [x] Pain in legs with walking   [] Pain in legs at rest   [] Pain in legs when laying flat   [] Claudication   [] Pain in feet when walking  [] Pain in feet at rest  [] Pain in feet when laying flat   [] History of DVT   [] Phlebitis   [] Swelling in legs   [] Varicose veins   [] Non-healing ulcers Pulmonary:   [] Uses home oxygen   [] Productive cough   [] Hemoptysis   [] Wheeze  [] COPD   [] Asthma Neurologic:  [] Dizziness  [] Blackouts   [] Seizures   [] History of stroke   [] History of TIA  [] Aphasia   [] Temporary blindness   [] Dysphagia   [] Weakness or numbness in arms   [] Weakness or numbness in legs Musculoskeletal:  [] Arthritis   []   Joint swelling   [] Joint pain   [] Low back pain Hematologic:  [] Easy bruising  [] Easy bleeding   [] Hypercoagulable state   [] Anemic  [] Hepatitis Gastrointestinal:  [] Blood in stool   [] Vomiting blood  [] Gastroesophageal reflux/heartburn   [] Abdominal pain Genitourinary:  [] Chronic kidney disease   [] Difficult urination  [] Frequent urination  [] Burning with urination   [] Hematuria Skin:  [] Rashes   [] Ulcers   [] Wounds Psychological:  [] History of anxiety   []  History of major depression.    Physical Exam BP 128/85 (BP Location: Right Arm)    Pulse 71    Resp 16    Ht 6' (1.829 m)    Wt 242 lb 9.6 oz (110 kg)    BMI 32.90 kg/m  Gen:  WD/WN, NAD Head: [place]/AT, No temporalis wasting.  Ear/Nose/Throat: Hearing grossly intact, nares w/o erythema or drainage, oropharynx w/o Erythema/Exudate Eyes: Conjunctiva clear, sclera non-icteric  Neck: trachea midline.  No JVD.  Pulmonary:  Good air  movement, respirations not labored, no use of accessory muscles  Cardiac: RRR, no JVD Vascular:  Vessel Right Left  Radial Palpable Palpable                          DP 2+ 2+  PT 2+ 2+   Gastrointestinal:. No masses, surgical incisions, or scars. Musculoskeletal: M/S 5/5 throughout.  Extremities without ischemic changes.  No deformity or atrophy.  Both feet are cool with somewhat sluggish capillary refill and some purplish discoloration.  Trace lower extremity edema. Neurologic: Sensation grossly intact in extremities.  Symmetrical.  Speech is fluent. Motor exam as listed above. Psychiatric: Judgment intact, Mood & affect appropriate for pt's clinical situation. Dermatologic: No rashes or ulcers noted.  No cellulitis or open wounds.    Radiology No results found.  Labs No results found for this or any previous visit (from the past 2160 hour(s)).  Assessment/Plan:  Neuropathy The patient has significant neuropathy symptoms to both lower extremities of unclear etiology. His ABIs were normal at 1.34 on the right and 1.36 on the left with triphasic waveforms and normal digital pressures bilaterally consistent with no arterial insufficiency in either lower extremity.  He has been started on Neurontin by his neurologist.  He does not have any ischemic component.  At this point, we will see the patient back as needed and no further vascular work-up or intervention is planned.  Pain in limb His ABIs were normal at 1.34 on the right and 1.36 on the left with triphasic waveforms and normal digital pressures bilaterally consistent with no arterial insufficiency in either lower extremity.  It does not appear as if his lower extremity symptoms are related to arterial insufficiency.  Return to clinic as needed.      Leotis Pain 05/16/2021, 9:30 AM

## 2021-07-19 ENCOUNTER — Telehealth: Payer: Self-pay | Admitting: Neurology

## 2021-07-19 ENCOUNTER — Ambulatory Visit (INDEPENDENT_AMBULATORY_CARE_PROVIDER_SITE_OTHER): Payer: 59 | Admitting: Neurology

## 2021-07-19 ENCOUNTER — Encounter: Payer: Self-pay | Admitting: Neurology

## 2021-07-19 VITALS — BP 156/100 | HR 78 | Ht 72.0 in | Wt 254.5 lb

## 2021-07-19 DIAGNOSIS — G4733 Obstructive sleep apnea (adult) (pediatric): Secondary | ICD-10-CM | POA: Diagnosis not present

## 2021-07-19 DIAGNOSIS — F1010 Alcohol abuse, uncomplicated: Secondary | ICD-10-CM | POA: Diagnosis not present

## 2021-07-19 DIAGNOSIS — Z8739 Personal history of other diseases of the musculoskeletal system and connective tissue: Secondary | ICD-10-CM | POA: Insufficient documentation

## 2021-07-19 DIAGNOSIS — R269 Unspecified abnormalities of gait and mobility: Secondary | ICD-10-CM | POA: Diagnosis not present

## 2021-07-19 DIAGNOSIS — G319 Degenerative disease of nervous system, unspecified: Secondary | ICD-10-CM | POA: Diagnosis not present

## 2021-07-19 DIAGNOSIS — M179 Osteoarthritis of knee, unspecified: Secondary | ICD-10-CM | POA: Insufficient documentation

## 2021-07-19 NOTE — Telephone Encounter (Signed)
Referral sent to [facility] [phone]. ?

## 2021-07-19 NOTE — Progress Notes (Signed)
Chief Complaint  Patient presents with   New Patient (Initial Visit)    Rm 14. Accompanied by daughter and wife. NP/Paper/[facility]/Kanhka Linthavong MD/second opinion, abnormality of gait and mobility. Pt reports worsening gait following Covid vaccination.      ASSESSMENT AND PLAN  Billy Campbell is a 61 y.o. 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   MEDICAL HISTORY:  Billy Campbell is a 61 year old male,, seen in request by his primary care physician Dr. Marisue Ivan, for evaluation of gait abnormality, initial evaluation was on July 19, 2021, he is accompanied by his wife and daughter at today's clinical visit.   I reviewed and summarized the referring note.PMHX. Gout Gastric bypass in 2018, lost 200 LB. 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 Sherryll Burger at [facility] in November 2022, EMG nerve conduction study showed generalized severe sensorimotor polyneuropathy  Extensive laboratory evaluations in December 2022: Normal CBC, hemoglobin of 17, CMP, creatinine of 0.8, A1c was 5.0, lipid panel showed total cholesterol of 209, LDL of 120, normal vitamin B12 460, urine heavy metal screen was negative, negative hepatitis panel, HIV, normal TSH,  Over the past year, his gait abnormality gradually getting worse, he become very sedentary, stay at home, rely on his walker, will hold on his wife to ambulate  He also had slow reaction time, mild memory loss, MoCA examination 23/30,  He has obesity, frequent awakening at nighttime, excessive daytime sleepiness and fatigue  He had extensive imaging study in 2022, I personally reviewed the film  MRI of cervical and thoracic spine, lumbar showed mild degenerative changes, no evidence of the spinal cord compression, high-grade foraminal stenosis of the cervical spine with severe right and moderate left neuroforaminal narrowing at C4-5, moderate bilaterally C5-6, severe left at the C6-7, moderate bilateral foraminal narrowing at L4-5, moderate to severe right and severe left neuroforaminal narrowing at L5-S1  The most noticeable abnormality is MRI of the brain, there is prominent cerebellar atrophy, especially upper vermis region, often associated with long-term alcohol abuse, mild small vessel disease  PHYSICAL EXAM:   Vitals:   07/19/21 0910 07/19/21 0916  BP: (!) 164/106 (!) 156/100  Pulse: 78 78  Weight: 254 lb 8 oz (115.4 kg)  Height: 6' (1.829 m)    Not recorded     Body mass index is 34.52 kg/m.  PHYSICAL EXAMNIATION:  Gen: NAD, conversant, well  nourised, well groomed                     Cardiovascular: Regular rate rhythm, no peripheral edema, warm, nontender. Eyes: Conjunctivae clear without exudates or hemorrhage Neck: Supple, no carotid bruits. Pulmonary: Clear to auscultation bilaterally   NEUROLOGICAL EXAM:  MENTAL STATUS: Speech:    Mild distress, anxious looking middle-age male Cognition:     Montreal Cognitive Assessment  07/19/2021  Visuospatial/ Executive (0/5) 4  Naming (0/3) 3  Attention: Read list of digits (0/2) 2  Attention: Read list of letters (0/1) 1  Attention: Serial 7 subtraction starting at 100 (0/3) 0  Language: Repeat phrase (0/2) 2  Language : Fluency (0/1) 0  Abstraction (0/2) 2  Delayed Recall (0/5) 4  Orientation (0/6) 5  Total 23      CRANIAL NERVES: CN II: Visual fields are full to confrontation. Pupils are round equal and briskly reactive to light. CN III, IV, VI: extraocular movement are normal. No ptosis. CN V: Facial sensation is intact to light touch CN VII: Face is symmetric with normal eye closure  CN VIII: Hearing is normal to causal conversation. CN IX, X: Phonation is normal. CN XI: Head turning and shoulder shrug are intact  MOTOR: He has mild discoloration of bilateral lower extremity, mild toe extension weakness, otherwise normal strength  REFLEXES: Reflexes are 2+ and symmetric at the biceps, triceps, knees, and absent at ankles. Plantar responses are flexor.  SENSORY: Length-dependent decreased to light touch, pinprick, vibratory sensation to distal shin level  COORDINATION: Significant bilateral lower extremity heel-to-shin dysmetria, alternated foot tapping, mild finger-to-nose dysmetria, significant truncal ataxia  GAIT/STANCE: Need push-up to get up from seated position, wide-based, very unsteady gait, need assistant  REVIEW OF SYSTEMS:  Full 14 system review of systems performed and notable only for as above All other review of systems were  negative.   ALLERGIES: No Known Allergies  HOME MEDICATIONS: Current Outpatient Medications  Medication Sig Dispense Refill   acetaminophen (TYLENOL) 500 MG tablet Take by mouth.     allopurinol (ZYLOPRIM) 300 MG tablet Take 300 mg by mouth daily.     cyanocobalamin 1000 MCG tablet Take by mouth.     diclofenac Sodium (VOLTAREN) 1 % GEL Apply 2 g topically 4 (four) times daily.     gabapentin (NEURONTIN) 300 MG capsule Take 300 mg by mouth 2 (two) times daily.     Ibuprofen 200 MG CAPS Take 400 mg by mouth as needed.     Misc Natural Products (OSTEO BI-FLEX TRIPLE STRENGTH PO) Take by mouth daily.     thiamine 100 MG tablet Take by mouth.     vitamin E 1000 UNIT capsule Take by mouth.     No current facility-administered medications for this visit.    PAST MEDICAL HISTORY: History reviewed. No pertinent past medical history.  PAST SURGICAL HISTORY: Past Surgical History:  Procedure Laterality Date   GASTRIC BYPASS      FAMILY HISTORY: Family History  Problem Relation Age of Onset   Hypertension Mother    Dementia Mother    Cancer Father    Stroke Father    Heart attack Father     SOCIAL HISTORY: Social History   Socioeconomic History   Marital status: Married    Spouse name: Not on file  Number of children: Not on file   Years of education: Not on file   Highest education level: Not on file  Occupational History   Not on file  Tobacco Use   Smoking status: Never   Smokeless tobacco: Current  Substance and Sexual Activity   Alcohol use: Yes   Drug use: Never   Sexual activity: Not on file  Other Topics Concern   Not on file  Social History Narrative   Not on file   Social Determinants of Health   Financial Resource Strain: Not on file  Food Insecurity: Not on file  Transportation Needs: Not on file  Physical Activity: Not on file  Stress: Not on file  Social Connections: Not on file  Intimate Partner Violence: Not on file     Total time  spent reviewing the chart, obtaining history, examined patient, ordering tests, documentation, consultations and family, care coordination was 75 minutes    Levert Feinstein, M.D. Ph.D.  [facility] [address] [place], [place] [address] Ph: [phone] Fax: [phone]  CC:  Marisue Ivan, MD [phone] [facility] [address] [facility] [place],  [place] [address]  Marisue Ivan, MD

## 2021-07-24 LAB — RPR: RPR Ser Ql: NONREACTIVE

## 2021-07-24 LAB — VITAMIN B1: Thiamine: 228.5 nmol/L — ABNORMAL HIGH (ref 66.5–200.0)

## 2021-07-24 LAB — FERRITIN: Ferritin: 324 ng/mL (ref 30–400)

## 2021-07-24 LAB — VITAMIN D 25 HYDROXY (VIT D DEFICIENCY, FRACTURES): Vit D, 25-Hydroxy: 33.8 ng/mL (ref 30.0–100.0)

## 2021-07-24 LAB — ZINC: Zinc: 83 ug/dL (ref 44–115)

## 2021-07-24 LAB — COPPER, SERUM: Copper: 86 ug/dL (ref 69–132)

## 2021-07-24 LAB — ANA W/REFLEX IF POSITIVE: Anti Nuclear Antibody (ANA): NEGATIVE

## 2021-07-24 LAB — C-REACTIVE PROTEIN: CRP: <1 mg/L (ref 0–10)

## 2021-07-24 LAB — CK: Total CK: 114 U/L (ref 41–331)

## 2021-07-24 LAB — SEDIMENTATION RATE: Sed Rate: 2 mm/hr (ref 0–30)

## 2021-09-20 ENCOUNTER — Institutional Professional Consult (permissible substitution): Payer: 59 | Admitting: Neurology

## 2022-03-05 ENCOUNTER — Encounter (INDEPENDENT_AMBULATORY_CARE_PROVIDER_SITE_OTHER): Payer: Self-pay

## 2022-12-05 ENCOUNTER — Emergency Department: Payer: 59

## 2022-12-05 ENCOUNTER — Emergency Department
Admission: EM | Admit: 2022-12-05 | Discharge: 2022-12-05 | Disposition: A | Discharge status: Home / Self Care | Payer: 59 | Attending: Emergency Medicine | Admitting: Emergency Medicine

## 2022-12-05 DIAGNOSIS — G6289 Other specified polyneuropathies: Secondary | ICD-10-CM | POA: Diagnosis not present

## 2022-12-05 DIAGNOSIS — Y907 Blood alcohol level of 200-239 mg/100 ml: Secondary | ICD-10-CM | POA: Diagnosis not present

## 2022-12-05 DIAGNOSIS — R531 Weakness: Secondary | ICD-10-CM | POA: Diagnosis present

## 2022-12-05 DIAGNOSIS — W19XXXA Unspecified fall, initial encounter: Secondary | ICD-10-CM | POA: Diagnosis not present

## 2022-12-05 LAB — CBC WITH DIFFERENTIAL/PLATELET
Abs Immature Granulocytes: 0.04 10*3/uL (ref 0.00–0.07)
Basophils Absolute: 0.1 10*3/uL (ref 0.0–0.1)
Basophils Relative: 1 %
Eosinophils Absolute: 0 10*3/uL (ref 0.0–0.5)
Eosinophils Relative: 0 %
HCT: 49.6 % (ref 39.0–52.0)
Hemoglobin: 17 g/dL (ref 13.0–17.0)
Immature Granulocytes: 0 %
Lymphocytes Relative: 19 %
Lymphs Abs: 2.1 10*3/uL (ref 0.7–4.0)
MCH: 31.8 pg (ref 26.0–34.0)
MCHC: 34.3 g/dL (ref 30.0–36.0)
MCV: 92.7 fL (ref 80.0–100.0)
Monocytes Absolute: 0.9 10*3/uL (ref 0.1–1.0)
Monocytes Relative: 8 %
Neutro Abs: 7.7 10*3/uL (ref 1.7–7.7)
Neutrophils Relative %: 72 %
Platelets: 213 10*3/uL (ref 150–400)
RBC: 5.35 MIL/uL (ref 4.22–5.81)
RDW: 12.6 % (ref 11.5–15.5)
WBC: 10.7 10*3/uL — ABNORMAL HIGH (ref 4.0–10.5)
nRBC: 0 % (ref 0.0–0.2)

## 2022-12-05 LAB — COMPREHENSIVE METABOLIC PANEL
ALT: 19 U/L (ref 0–44)
AST: 30 U/L (ref 15–41)
Albumin: 4 g/dL (ref 3.5–5.0)
Alkaline Phosphatase: 93 U/L (ref 38–126)
Anion gap: 8 (ref 5–15)
BUN: 12 mg/dL (ref 8–23)
CO2: 24 mmol/L (ref 22–32)
Calcium: 8.7 mg/dL — ABNORMAL LOW (ref 8.9–10.3)
Chloride: 106 mmol/L (ref 98–111)
Creatinine, Ser: 0.92 mg/dL (ref 0.61–1.24)
GFR, Estimated: >60 mL/min (ref >60)
Glucose, Bld: 102 mg/dL — ABNORMAL HIGH (ref 70–99)
Potassium: 3.7 mmol/L (ref 3.5–5.1)
Sodium: 138 mmol/L (ref 135–145)
Total Bilirubin: 0.6 mg/dL (ref 0.3–1.2)
Total Protein: 7.4 g/dL (ref 6.5–8.1)

## 2022-12-05 LAB — CK: Total CK: 652 U/L — ABNORMAL HIGH (ref 49–397)

## 2022-12-05 LAB — ETHANOL: Alcohol, Ethyl (B): 216 mg/dL — ABNORMAL HIGH (ref <10)

## 2022-12-05 LAB — TROPONIN I (HIGH SENSITIVITY): Troponin I (High Sensitivity): 8 ng/L (ref <18)

## 2022-12-05 LAB — LIPASE, BLOOD: Lipase: 42 U/L (ref 11–51)

## 2022-12-05 MED ORDER — SODIUM CHLORIDE 0.9 % IV BOLUS
1000.0000 mL | Freq: Once | INTRAVENOUS | Status: AC
  Administered 2022-12-05: 1000 mL via INTRAVENOUS

## 2022-12-05 MED ORDER — LORAZEPAM 1 MG PO TABS
1.0000 mg | ORAL_TABLET | Freq: Once | ORAL | Status: DC
  Filled 2022-12-05: qty 1

## 2022-12-05 NOTE — ED Provider Notes (Signed)
[facility] Provider Note  Patient Contact: 9:36 PM (approximate)   History   No chief complaint on file.   HPI  Billy Campbell is a 62 y.o. male who presents the to the emergency department after being found on the ground after a fall.  Patient has a history of neuropathy, states that he is experienced several falls over the last several days.  He states that this is not atypical for him who family states that it is.  Patient is also heavily using alcohol by his own admission.  He has drank 1-1/2 gallons of vodka in the last 24 hours.  Patient arrives with no complaints, states that he is only here because his family made him come to get checked out.  He denies headache, visual changes, unilateral weakness, slurred speech, chest pain, shortness of breath, abdominal pain, musculoskeletal pain.  Patient states that he fell because his legs were weak which is nothing new.  Family endorse multiple falls but it does not appear that patient has been complaining of other concerning symptoms at home.  The patient has some bruising and some abrasions from his previous fall though they do not see evidence of new trauma from today's fall.  Physical Exam   Triage Vital Signs: ED Triage Vitals  Encounter Vitals Group     BP 12/05/22 1805 134/89     Systolic BP Percentile --      Diastolic BP Percentile --      Pulse Rate 12/05/22 1805 (!) 102     Resp 12/05/22 1805 18     Temp 12/05/22 1805 98.2 F (36.8 C)     Temp Source 12/05/22 1805 Oral     SpO2 12/05/22 1802 95 %     Weight 12/05/22 1809 247 lb (112 kg)     Height 12/05/22 1809 6\' 1"  (1.854 m)     Head Circumference --      Peak Flow --      Pain Score 12/05/22 1807 0     Pain Loc --      Pain Education --      Exclude from Growth Chart --     Most recent vital signs: Vitals:   12/05/22 2000 12/05/22 2030  BP: 125/75 124/82  Pulse: 81 88  Resp: 14 20  Temp:    SpO2: 95% 98%     General: Alert and  in no acute distress. Head: No acute traumatic findings  Neck: No stridor. No cervical spine tenderness to palpation.  Abrasion noted to the neck which appears at least 24 hours old.  No evidence of of cellulitic changes.  Cardiovascular:  Good peripheral perfusion Respiratory: Normal respiratory effort without tachypnea or retractions. Lungs CTAB. Good air entry to the bases with no decreased or absent breath sounds. Gastrointestinal: Bowel sounds 4 quadrants. Soft and nontender to palpation. No guarding or rigidity. No palpable masses. No distention. No CVA tenderness. Musculoskeletal: Full range of motion to all extremities.  Neurologic:  No gross focal neurologic deficits are appreciated.  Skin:   No rash noted Other:   ED Results / Procedures / Treatments   Labs (all labs ordered are listed, but only abnormal results are displayed) Labs Reviewed  COMPREHENSIVE METABOLIC PANEL - Abnormal; Notable for the following components:      Result Value   Glucose, Bld 102 (*)    Calcium 8.7 (*)    All other components within normal limits  CBC WITH DIFFERENTIAL/PLATELET - Abnormal;  Notable for the following components:   WBC 10.7 (*)    All other components within normal limits  CK - Abnormal; Notable for the following components:   Total CK 652 (*)    All other components within normal limits  ETHANOL - Abnormal; Notable for the following components:   Alcohol, Ethyl (B) 216 (*)    All other components within normal limits  LIPASE, BLOOD  URINALYSIS, ROUTINE W REFLEX MICROSCOPIC  TROPONIN I (HIGH SENSITIVITY)     EKG  ED ECG REPORT I, Delorise Royals Rashid Whitenight,  personally viewed and interpreted this ECG.   Date: 12/05/2022  EKG Time: 1806 hrs.  Rate: 100 bpm  Rhythm: there are no previous tracings available for comparison, normal sinus rhythm  Axis: Left axis  Intervals:none  ST&T Change: No gross ST elevation or depression noted  Normal sinus rhythm.  No  STEMI    RADIOLOGY  I personally viewed, evaluated, and interpreted these images as part of my medical decision making, as well as reviewing the written report by the radiologist.  ED Provider Interpretation: No acute findings on CT scan of the head or neck  DG Chest 2 View  Result Date: 12/05/2022 CLINICAL DATA:  Weakness and fall. EXAM: CHEST - 2 VIEW COMPARISON:  None Available. FINDINGS: There is eventration of the right hemidiaphragm. No focal consolidation, pleural effusion or pneumothorax. The cardiac silhouette is within normal limits. No acute osseous pathology. IMPRESSION: No active cardiopulmonary disease. Electronically Signed   By: Elgie Collard M.D.   On: 12/05/2022 21:37   CT Cervical Spine Wo Contrast  Result Date: 12/05/2022 CLINICAL DATA:  Bilateral lower extremity weakness.  Fall. EXAM: CT CERVICAL SPINE WITHOUT CONTRAST TECHNIQUE: Multidetector CT imaging of the cervical spine was performed without intravenous contrast. Multiplanar CT image reconstructions were also generated. RADIATION DOSE REDUCTION: This exam was performed according to the departmental dose-optimization program which includes automated exposure control, adjustment of the mA and/or kV according to patient size and/or use of iterative reconstruction technique. COMPARISON:  MRI 02/09/2021 FINDINGS: Alignment: Mild kyphotic curvature, probably positional. Skull base and vertebrae: No fracture or focal lesion. Chronic ossicle at the spinous process T1. Soft tissues and spinal canal: No traumatic finding. Disc levels: No significant disc level spondylosis. Facet osteoarthritis most pronounced on the right at C4-5. Developmentally abnormal superior articular process on the right at C6 with some bony foraminal stenosis. 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 Upper chest: Negative Other: None IMPRESSION: No acute or traumatic finding. Mild degenerative and developmental findings as above. Electronically Signed   By:  Paulina Fusi M.D.   On: 12/05/2022 20:47   CT Head Wo Contrast  Result Date: 12/05/2022 CLINICAL DATA:  Leg weakness with fall EXAM: CT HEAD WITHOUT CONTRAST TECHNIQUE: Contiguous axial images were obtained from the base of the skull through the vertex without intravenous contrast. RADIATION DOSE REDUCTION: This exam was performed according to the departmental dose-optimization program which includes automated exposure control, adjustment of the mA and/or kV according to patient size and/or use of iterative reconstruction technique. COMPARISON:  MRI 02/09/2021 FINDINGS: Brain: No acute finding. Mild age-related volume loss. No sign of acute infarction, mass lesion, hemorrhage, hydrocephalus or extra-axial collection. Vascular: No abnormal vascular finding. Skull: Negative Sinuses/Orbits: Claire/normal Other: None IMPRESSION: Negative head CT.  No acute finding. Electronically Signed   By: Paulina Fusi M.D.   On: 12/05/2022 20:45    PROCEDURES:  Critical Care performed: No  Procedures   MEDICATIONS ORDERED IN ED:  Medications  LORazepam (ATIVAN) tablet 1 mg (1 mg Oral Patient Refused/Not Given 12/05/22 2059)  sodium chloride 0.9 % bolus 1,000 mL (1,000 mLs Intravenous New Bag/Given 12/05/22 1959)     IMPRESSION / MDM / ASSESSMENT AND PLAN / ED COURSE  I reviewed the triage vital signs and the nursing notes.                                 Differential diagnosis includes, but is not limited to, fall, rhabdo, dehydration, ACS/STEMI, CVA, skull fracture, intracranial hemorrhage, musculoskeletal injury   Patient's presentation is most consistent with acute presentation with potential threat to life or bodily function.   Patient's diagnosis is consistent with neuropathy, fall.  Patient presents emergency department after a fall.  Patient has had several falls over the last several days.  Patient reports that this is not atypical..  Patient had labs, imaging, EKG.  Overall results are  reassuring.  There is no acute traumatic findings on imaging.  Labs are reassuring.  Slight bump in his CK but do not feel that patient is likely in rhabdo given the slight elevation.  At this time I recommend hydration at home, decreasing alcohol intake.  Patient is aware of these recommendations but states that he can determine how much he should drink.  At this time patient is requesting discharge.  Patient is stable for discharge at this time.  Patient is given ED precautions to return to the ED for any worsening or new symptoms.     FINAL CLINICAL IMPRESSION(S) / ED DIAGNOSES   Final diagnoses:  Fall, initial encounter  Other polyneuropathy     Rx / DC Orders   ED Discharge Orders     None        Note:  This document was prepared using Dragon voice recognition software and may include unintentional dictation errors.   Lanette Hampshire 12/05/22 2144    Minna Antis, MD 12/11/22 2300

## 2022-12-05 NOTE — ED Notes (Signed)
Pt verbalizes understanding of discharge instructions. Opportunity for questioning and answers were provided. Pt discharged from ED to home with family.    

## 2022-12-05 NOTE — ED Triage Notes (Signed)
Pt presents to the ED via [facility] from home. Pt has neuropathy in bilateral lower extremities. Pt states that he was outside in the heat today for about four hours. He states that his legs were weak so he started to fall and slid down under his gator. Pt states that he did NOT fall. Large scratch noted to left side of neck that appears to be old. Large bruise noted to the left side of torso under left arm. Pt states that both the scratch and bruise occurred from a different event. ETOH present.  Family states that patient had a fall yesterday and Sunday as well.

## 2022-12-05 NOTE — ED Notes (Signed)
Daughter informed this RN that patient is becoming anxious and restless. Provider notified.

## 2023-06-13 ENCOUNTER — Other Ambulatory Visit: Payer: Self-pay | Admitting: Family Medicine

## 2023-06-13 DIAGNOSIS — R222 Localized swelling, mass and lump, trunk: Secondary | ICD-10-CM

## 2023-06-19 ENCOUNTER — Ambulatory Visit
Admission: RE | Admit: 2023-06-19 | Discharge: 2023-06-19 | Disposition: A | Discharge status: Home / Self Care | Payer: 59 | Source: Ambulatory Visit | Attending: Family Medicine | Admitting: Family Medicine

## 2023-06-19 DIAGNOSIS — R222 Localized swelling, mass and lump, trunk: Secondary | ICD-10-CM

## 2023-06-19 MED ORDER — GADOBUTROL 1 MMOL/ML IV SOLN
10.0000 mL | Freq: Once | INTRAVENOUS | Status: AC | PRN
  Administered 2023-06-19: 10 mL via INTRAVENOUS

## 2023-06-19 NOTE — Progress Notes (Signed)
Spoke with Dr Vinnie Level. No need for abd imaging. Pt has large flat mass on upper right hand buttocks area. Lateral in profile. We will proceed with pelvis with and without imaging.
